# Patient Record
Sex: Male | Born: 1980 | Race: Black or African American | Hispanic: No | Marital: Married
Health system: Southern US, Community
[De-identification: ages and names within clinical notes are randomized; demographics above are authoritative.]

## PROBLEM LIST (undated history)

## (undated) ENCOUNTER — Emergency Department (HOSPITAL_COMMUNITY): Discharge status: Absent without leave | Payer: Self-pay

---

## 1998-10-16 ENCOUNTER — Emergency Department (HOSPITAL_COMMUNITY): Admission: EM | Admit: 1998-10-16 | Discharge: 1998-10-17 | Payer: Self-pay | Admitting: *Deleted

## 2002-05-09 ENCOUNTER — Encounter: Payer: Self-pay | Admitting: Emergency Medicine

## 2002-05-09 ENCOUNTER — Emergency Department (HOSPITAL_COMMUNITY): Admission: EM | Admit: 2002-05-09 | Discharge: 2002-05-09 | Payer: Self-pay | Admitting: Emergency Medicine

## 2002-06-29 ENCOUNTER — Emergency Department (HOSPITAL_COMMUNITY): Admission: EM | Admit: 2002-06-29 | Discharge: 2002-06-29 | Payer: Self-pay | Admitting: Emergency Medicine

## 2003-08-15 ENCOUNTER — Emergency Department (HOSPITAL_COMMUNITY): Admission: EM | Admit: 2003-08-15 | Discharge: 2003-08-15 | Payer: Self-pay | Admitting: *Deleted

## 2005-07-08 ENCOUNTER — Emergency Department (HOSPITAL_COMMUNITY): Admission: EM | Admit: 2005-07-08 | Discharge: 2005-07-08 | Payer: Self-pay | Admitting: Family Medicine

## 2005-07-28 ENCOUNTER — Emergency Department (HOSPITAL_COMMUNITY): Admission: EM | Admit: 2005-07-28 | Discharge: 2005-07-28 | Payer: Self-pay | Admitting: Family Medicine

## 2005-07-30 ENCOUNTER — Emergency Department (HOSPITAL_COMMUNITY): Admission: EM | Admit: 2005-07-30 | Discharge: 2005-07-30 | Payer: Self-pay | Admitting: Family Medicine

## 2007-04-23 ENCOUNTER — Emergency Department (HOSPITAL_COMMUNITY): Admission: EM | Admit: 2007-04-23 | Discharge: 2007-04-24 | Payer: Self-pay | Admitting: Emergency Medicine

## 2009-06-01 ENCOUNTER — Emergency Department (HOSPITAL_COMMUNITY): Admission: EM | Admit: 2009-06-01 | Discharge: 2009-06-01 | Payer: Self-pay | Admitting: Family Medicine

## 2012-01-16 ENCOUNTER — Emergency Department (HOSPITAL_COMMUNITY)
Admission: EM | Admit: 2012-01-16 | Discharge: 2012-01-16 | Disposition: A | Discharge status: Home / Self Care | Payer: Managed Care, Other (non HMO) | Attending: Emergency Medicine | Admitting: Emergency Medicine

## 2012-01-16 ENCOUNTER — Encounter (HOSPITAL_COMMUNITY): Payer: Self-pay | Admitting: Emergency Medicine

## 2012-01-16 DIAGNOSIS — F17200 Nicotine dependence, unspecified, uncomplicated: Secondary | ICD-10-CM | POA: Insufficient documentation

## 2012-01-16 DIAGNOSIS — J029 Acute pharyngitis, unspecified: Secondary | ICD-10-CM | POA: Insufficient documentation

## 2012-01-16 DIAGNOSIS — R509 Fever, unspecified: Secondary | ICD-10-CM | POA: Insufficient documentation

## 2012-01-16 MED ORDER — HYDROCODONE-ACETAMINOPHEN 7.5-325 MG/15ML PO SOLN: 15.0000 mL | Freq: Three times a day (TID) | ORAL | Status: DC | PRN

## 2012-01-16 MED ORDER — KETOROLAC TROMETHAMINE 60 MG/2ML IM SOLN
60.0000 mg | Freq: Once | INTRAMUSCULAR | Status: AC
  Administered 2012-01-16: 60 mg via INTRAMUSCULAR
  Filled 2012-01-16: qty 2

## 2012-01-16 NOTE — ED Provider Notes (Signed)
History     CSN: [id number]  Arrival date & time 01/16/12  5784   First MD Initiated Contact with Patient 01/16/12 [phone]      Chief Complaint  Patient presents with  . Sore Throat    (Consider location/radiation/quality/duration/timing/severity/associated sxs/prior treatment) HPI Comments: The patient complains of gradual onset of a sore throat which has been ongoing for 4 days, left side is worse than the right side, nothing seems to make it better, worse with swallowing. The pain does radiate to his left ear. He admits to a mild intermittent cough mild nasal congestion but no objective fevers. He does have subjective fevers. There is no rashes, swelling, vomiting. He has no known sick contacts. He took ibuprofen several hours ago with minimal improvement.  Patient is a 31 y.o. male presenting with pharyngitis. The history is provided by the patient.  Sore Throat Pertinent negatives include no abdominal pain.    History reviewed. No pertinent past medical history.  History reviewed. No pertinent past surgical history.  Family History  Problem Relation Age of Onset  . Diabetes Other   . CAD Other   . Thyroid disease Other   . Cancer Other     History  Substance Use Topics  . Smoking status: Current Some Day Smoker  . Smokeless tobacco: Not on file  . Alcohol Use: No      Review of Systems  Constitutional: Positive for fever (subjective).  HENT: Positive for sore throat.   Gastrointestinal: Negative for nausea, vomiting and abdominal pain.    Allergies  Review of patient's allergies indicates no known allergies.  Home Medications   Current Outpatient Rx  Name Route Sig Dispense Refill  . IBUPROFEN 200 MG PO TABS Oral Take 400 mg by mouth every 6 (six) hours as needed.    Marland Kitchen HYDROCODONE-ACETAMINOPHEN 7.5-325 MG/15ML PO SOLN Oral Take 15 mLs by mouth every 8 (eight) hours as needed for pain. 120 mL 0    BP 135/87  Pulse 89  Temp 98.8 F (37.1 C) (Oral)  Resp  20  SpO2 100%  Physical Exam  Nursing note and vitals reviewed. Constitutional: He appears well-developed and well-nourished. No distress.  HENT:  Head: Normocephalic and atraumatic.  Mouth/Throat: Oropharyngeal exudate (left-sided tonsillar exudate) present.       No asymmetry, hypertrophy of the posterior pharynx. Erythema present, left tonsillar exudate present.  Eyes: Conjunctivae normal are normal. No scleral icterus.  Neck: Normal range of motion. Neck supple. No thyromegaly present.  Cardiovascular: Normal rate and regular rhythm.   Pulmonary/Chest: Effort normal and breath sounds normal.  Lymphadenopathy:    He has no cervical adenopathy.  Neurological: He is alert.  Skin: Skin is warm and dry. No rash noted. He is not diaphoretic.    ED Course  Procedures (including critical care time)   Labs Reviewed  RAPID STREP SCREEN   No results found.   1. Pharyngitis       MDM  Overall the patient is well-appearing with normal vital signs, I have personally swabbed his throat for strep, rapid strep pending, Toradol given   Test negative, patient stable for discharge, hydrocodone suspension given for pain     Vida Roller, MD 01/16/12 [phone]

## 2012-01-16 NOTE — ED Notes (Signed)
Pt states his throat is sore and his left side of his throat is swollen  Pt states the pain radiates to his ear  Pt states his throat has a white coating on it

## 2012-03-23 ENCOUNTER — Emergency Department (HOSPITAL_COMMUNITY)
Admission: EM | Admit: 2012-03-23 | Discharge: 2012-03-24 | Disposition: A | Discharge status: Home / Self Care | Payer: Managed Care, Other (non HMO) | Attending: Emergency Medicine | Admitting: Emergency Medicine

## 2012-03-23 ENCOUNTER — Encounter (HOSPITAL_COMMUNITY): Payer: Self-pay | Admitting: *Deleted

## 2012-03-23 DIAGNOSIS — Z79899 Other long term (current) drug therapy: Secondary | ICD-10-CM | POA: Insufficient documentation

## 2012-03-23 DIAGNOSIS — R1012 Left upper quadrant pain: Secondary | ICD-10-CM | POA: Insufficient documentation

## 2012-03-23 DIAGNOSIS — K5289 Other specified noninfective gastroenteritis and colitis: Secondary | ICD-10-CM | POA: Insufficient documentation

## 2012-03-23 DIAGNOSIS — F17200 Nicotine dependence, unspecified, uncomplicated: Secondary | ICD-10-CM | POA: Insufficient documentation

## 2012-03-23 DIAGNOSIS — B9789 Other viral agents as the cause of diseases classified elsewhere: Secondary | ICD-10-CM | POA: Insufficient documentation

## 2012-03-23 DIAGNOSIS — B349 Viral infection, unspecified: Secondary | ICD-10-CM

## 2012-03-23 DIAGNOSIS — R112 Nausea with vomiting, unspecified: Secondary | ICD-10-CM | POA: Insufficient documentation

## 2012-03-23 DIAGNOSIS — K529 Noninfective gastroenteritis and colitis, unspecified: Secondary | ICD-10-CM

## 2012-03-23 MED ORDER — ACETAMINOPHEN-CODEINE #2 300-15 MG PO TABS: 1.0000 | ORAL_TABLET | ORAL | Status: AC | PRN

## 2012-03-23 MED ORDER — IBUPROFEN 400 MG PO TABS: 400.0000 mg | ORAL_TABLET | Freq: Four times a day (QID) | ORAL | Status: AC | PRN

## 2012-03-23 MED ORDER — ONDANSETRON 8 MG PO TBDP: 8.0000 mg | ORAL_TABLET | Freq: Three times a day (TID) | ORAL | Status: AC | PRN

## 2012-03-23 NOTE — ED Provider Notes (Signed)
History     CSN: [id number]  Arrival date & time 03/23/12  2223   First MD Initiated Contact with Patient 03/23/12 2300      Chief Complaint  Patient presents with  . Emesis    (Consider location/radiation/quality/duration/timing/severity/associated sxs/prior treatment) HPI Comments: Pt with no medical hx, surgical hx comes in with cc of abd pain. Pt's sx started last night, and between 9 pm last night and 4 pm tonight, he had 4 episodes of emesis and 3 episodes of BM - formed stools. He also has crampy abd pain. Reduced po intake. No fevers, chills, no weakness, sob, dizziness.    Patient is a 32 y.o. male presenting with vomiting. The history is provided by the patient.  Emesis  Associated symptoms include abdominal pain. Pertinent negatives include no cough.    History reviewed. No pertinent past medical history.  History reviewed. No pertinent past surgical history.  Family History  Problem Relation Age of Onset  . Diabetes Other   . CAD Other   . Thyroid disease Other   . Cancer Other     History  Substance Use Topics  . Smoking status: Current Some Day Smoker    Types: Cigarettes  . Smokeless tobacco: Not on file  . Alcohol Use: No      Review of Systems  Constitutional: Negative for activity change and appetite change.  Respiratory: Negative for cough and shortness of breath.   Cardiovascular: Negative for chest pain.  Gastrointestinal: Positive for nausea, vomiting and abdominal pain. Negative for blood in stool.  Genitourinary: Negative for dysuria.    Allergies  Review of patient's allergies indicates no known allergies.  Home Medications   Current Outpatient Rx  Name  Route  Sig  Dispense  Refill  . IBUPROFEN 200 MG PO TABS   Oral   Take 400 mg by mouth every 6 (six) hours as needed. pain         . ACETAMINOPHEN-CODEINE #2 300-15 MG PO TABS   Oral   Take 1 tablet by mouth every 4 (four) hours as needed for pain.   30 tablet   0   .  IBUPROFEN 400 MG PO TABS   Oral   Take 1 tablet (400 mg total) by mouth every 6 (six) hours as needed for pain.   30 tablet   0   . ONDANSETRON 8 MG PO TBDP   Oral   Take 1 tablet (8 mg total) by mouth every 8 (eight) hours as needed for nausea.   20 tablet   0     BP 124/75  Pulse 90  Temp 98.9 F (37.2 C)  Resp 20  SpO2 98%  Physical Exam  Constitutional: He is oriented to person, place, and time. He appears well-developed.  HENT:  Head: Normocephalic and atraumatic.  Eyes: Conjunctivae normal and EOM are normal. Pupils are equal, round, and reactive to light.  Neck: Normal range of motion. Neck supple.  Cardiovascular: Normal rate and regular rhythm.   Pulmonary/Chest: Effort normal and breath sounds normal.  Abdominal: Soft. Bowel sounds are normal. He exhibits no distension. There is tenderness. There is no rebound and no guarding.       Mild LUQ tenderness  Neurological: He is alert and oriented to person, place, and time.  Skin: Skin is warm.    ED Course  Procedures (including critical care time)  Labs Reviewed - No data to display No results found.   1. Viral syndrome  2. Gastroenteritis       MDM  Pt comes in with cc of abd pain, with some nausea and emesis. He has no abd surgery hx. No other concerning constitutionals. Suspect viral gastroenteritis. No objective evidence of dehydration. Will d/c.    Derwood Kaplan, MD 03/23/12 2356

## 2012-03-23 NOTE — ED Notes (Signed)
Pt c/o vomiting x 2 days; abd pain

## 2013-12-22 ENCOUNTER — Emergency Department (HOSPITAL_COMMUNITY): Payer: BC Managed Care – PPO

## 2013-12-22 ENCOUNTER — Inpatient Hospital Stay (HOSPITAL_COMMUNITY): Payer: BC Managed Care – PPO

## 2013-12-22 ENCOUNTER — Encounter (HOSPITAL_COMMUNITY): Payer: Self-pay | Admitting: Emergency Medicine

## 2013-12-22 ENCOUNTER — Encounter (HOSPITAL_COMMUNITY): Admission: EM | Disposition: E | Payer: Self-pay | Source: Home / Self Care

## 2013-12-22 ENCOUNTER — Encounter (HOSPITAL_COMMUNITY): Payer: BC Managed Care – PPO | Admitting: Anesthesiology

## 2013-12-22 ENCOUNTER — Emergency Department (HOSPITAL_COMMUNITY): Payer: BC Managed Care – PPO | Admitting: Anesthesiology

## 2013-12-22 ENCOUNTER — Inpatient Hospital Stay (HOSPITAL_COMMUNITY)
Admission: EM | Admit: 2013-12-22 | Discharge: 2014-01-15 | DRG: 907 | Disposition: E | Payer: BC Managed Care – PPO | Attending: General Surgery | Admitting: General Surgery

## 2013-12-22 DIAGNOSIS — Z9119 Patient's noncompliance with other medical treatment and regimen: Secondary | ICD-10-CM | POA: Diagnosis present

## 2013-12-22 DIAGNOSIS — W3400XA Accidental discharge from unspecified firearms or gun, initial encounter: Secondary | ICD-10-CM | POA: Diagnosis not present

## 2013-12-22 DIAGNOSIS — S31601A Unspecified open wound of abdominal wall, left upper quadrant with penetration into peritoneal cavity, initial encounter: Secondary | ICD-10-CM | POA: Diagnosis present

## 2013-12-22 DIAGNOSIS — R739 Hyperglycemia, unspecified: Secondary | ICD-10-CM | POA: Diagnosis present

## 2013-12-22 DIAGNOSIS — E870 Hyperosmolality and hypernatremia: Secondary | ICD-10-CM | POA: Diagnosis present

## 2013-12-22 DIAGNOSIS — N179 Acute kidney failure, unspecified: Secondary | ICD-10-CM | POA: Diagnosis present

## 2013-12-22 DIAGNOSIS — Z4659 Encounter for fitting and adjustment of other gastrointestinal appliance and device: Secondary | ICD-10-CM

## 2013-12-22 DIAGNOSIS — G9340 Encephalopathy, unspecified: Secondary | ICD-10-CM | POA: Diagnosis not present

## 2013-12-22 DIAGNOSIS — Z9889 Other specified postprocedural states: Secondary | ICD-10-CM

## 2013-12-22 DIAGNOSIS — J9811 Atelectasis: Secondary | ICD-10-CM | POA: Diagnosis not present

## 2013-12-22 DIAGNOSIS — T8119XA Other postprocedural shock, initial encounter: Secondary | ICD-10-CM | POA: Diagnosis not present

## 2013-12-22 DIAGNOSIS — K659 Peritonitis, unspecified: Secondary | ICD-10-CM | POA: Diagnosis not present

## 2013-12-22 DIAGNOSIS — D62 Acute posthemorrhagic anemia: Secondary | ICD-10-CM | POA: Diagnosis present

## 2013-12-22 DIAGNOSIS — K559 Vascular disorder of intestine, unspecified: Secondary | ICD-10-CM | POA: Diagnosis not present

## 2013-12-22 DIAGNOSIS — K7200 Acute and subacute hepatic failure without coma: Secondary | ICD-10-CM | POA: Diagnosis not present

## 2013-12-22 DIAGNOSIS — S35222A Major laceration of superior mesenteric artery, initial encounter: Secondary | ICD-10-CM | POA: Diagnosis present

## 2013-12-22 DIAGNOSIS — R6520 Severe sepsis without septic shock: Secondary | ICD-10-CM

## 2013-12-22 DIAGNOSIS — A419 Sepsis, unspecified organism: Secondary | ICD-10-CM

## 2013-12-22 DIAGNOSIS — Z978 Presence of other specified devices: Secondary | ICD-10-CM

## 2013-12-22 DIAGNOSIS — E872 Acidosis: Secondary | ICD-10-CM | POA: Diagnosis not present

## 2013-12-22 DIAGNOSIS — S31109A Unspecified open wound of abdominal wall, unspecified quadrant without penetration into peritoneal cavity, initial encounter: Secondary | ICD-10-CM | POA: Diagnosis present

## 2013-12-22 DIAGNOSIS — S36499A Other injury of unspecified part of small intestine, initial encounter: Secondary | ICD-10-CM | POA: Diagnosis present

## 2013-12-22 DIAGNOSIS — R6521 Severe sepsis with septic shock: Secondary | ICD-10-CM | POA: Diagnosis not present

## 2013-12-22 DIAGNOSIS — I472 Ventricular tachycardia: Secondary | ICD-10-CM | POA: Diagnosis present

## 2013-12-22 DIAGNOSIS — S36290A Other injury of head of pancreas, initial encounter: Secondary | ICD-10-CM | POA: Diagnosis present

## 2013-12-22 DIAGNOSIS — J9690 Respiratory failure, unspecified, unspecified whether with hypoxia or hypercapnia: Secondary | ICD-10-CM | POA: Diagnosis not present

## 2013-12-22 DIAGNOSIS — I959 Hypotension, unspecified: Secondary | ICD-10-CM | POA: Diagnosis present

## 2013-12-22 DIAGNOSIS — J9601 Acute respiratory failure with hypoxia: Secondary | ICD-10-CM

## 2013-12-22 DIAGNOSIS — R571 Hypovolemic shock: Secondary | ICD-10-CM

## 2013-12-22 DIAGNOSIS — E162 Hypoglycemia, unspecified: Secondary | ICD-10-CM | POA: Diagnosis not present

## 2013-12-22 DIAGNOSIS — J939 Pneumothorax, unspecified: Secondary | ICD-10-CM

## 2013-12-22 DIAGNOSIS — D65 Disseminated intravascular coagulation [defibrination syndrome]: Secondary | ICD-10-CM | POA: Diagnosis present

## 2013-12-22 DIAGNOSIS — S31139A Puncture wound of abdominal wall without foreign body, unspecified quadrant without penetration into peritoneal cavity, initial encounter: Secondary | ICD-10-CM

## 2013-12-22 DIAGNOSIS — Z452 Encounter for adjustment and management of vascular access device: Secondary | ICD-10-CM

## 2013-12-22 HISTORY — PX: APPLICATION OF WOUND VAC: SHX5189

## 2013-12-22 HISTORY — PX: BOWEL RESECTION: SHX1257

## 2013-12-22 HISTORY — PX: LAPAROTOMY: SHX154

## 2013-12-22 HISTORY — PX: MESENTERIC ARTERY BYPASS: SHX5968

## 2013-12-22 HISTORY — PX: CHEST TUBE INSERTION: SHX231

## 2013-12-22 HISTORY — PX: THORACIC AORTIC ANEURYSM REPAIR: SHX799

## 2013-12-22 LAB — POCT I-STAT 7, (LYTES, BLD GAS, ICA,H+H)
ACID-BASE DEFICIT: 3 mmol/L — AB (ref 0.0–2.0)
Acid-Base Excess: 1 mmol/L (ref 0.0–2.0)
Acid-base deficit: 17 mmol/L — ABNORMAL HIGH (ref 0.0–2.0)
Acid-base deficit: 26 mmol/L — ABNORMAL HIGH (ref 0.0–2.0)
Acid-base deficit: 11 mmol/L — ABNORMAL HIGH (ref 0.0–2.0)
Acid-base deficit: 7 mmol/L — ABNORMAL HIGH (ref 0.0–2.0)
Acid-base deficit: 4 mmol/L — ABNORMAL HIGH (ref 0.0–2.0)
Acid-base deficit: 1 mmol/L (ref 0.0–2.0)
BICARBONATE: 7.9 meq/L — AB (ref 20.0–24.0)
Bicarbonate: 13.7 meq/L — ABNORMAL LOW (ref 20.0–24.0)
Bicarbonate: 16.7 meq/L — ABNORMAL LOW (ref 20.0–24.0)
Bicarbonate: 19.7 meq/L — ABNORMAL LOW (ref 20.0–24.0)
Bicarbonate: 21.9 meq/L (ref 20.0–24.0)
Bicarbonate: 23.1 mEq/L (ref 20.0–24.0)
Bicarbonate: 25.2 mEq/L — ABNORMAL HIGH (ref 20.0–24.0)
Bicarbonate: 21 mEq/L (ref 20.0–24.0)
CALCIUM ION: 0.79 mmol/L — AB (ref 1.12–1.23)
CALCIUM ION: 1.05 mmol/L — AB (ref 1.12–1.23)
Calcium, Ion: 0.6 mmol/L — CL (ref 1.12–1.23)
Calcium, Ion: 0.89 mmol/L — ABNORMAL LOW (ref 1.12–1.23)
Calcium, Ion: 0.65 mmol/L — ABNORMAL LOW (ref 1.12–1.23)
Calcium, Ion: 0.68 mmol/L — ABNORMAL LOW (ref 1.12–1.23)
Calcium, Ion: 0.63 mmol/L — CL (ref 1.12–1.23)
Calcium, Ion: 0.93 mmol/L — ABNORMAL LOW (ref 1.12–1.23)
HCT: 20 % — ABNORMAL LOW (ref 39.0–52.0)
HCT: 12 % — ABNORMAL LOW (ref 39.0–52.0)
HCT: 26 % — ABNORMAL LOW (ref 39.0–52.0)
HCT: 25 % — ABNORMAL LOW (ref 39.0–52.0)
HCT: 17 % — ABNORMAL LOW (ref 39.0–52.0)
HCT: 26 % — ABNORMAL LOW (ref 39.0–52.0)
HEMATOCRIT: 19 % — AB (ref 39.0–52.0)
HEMATOCRIT: 32 % — AB (ref 39.0–52.0)
HEMOGLOBIN: 6.5 g/dL — AB (ref 13.0–17.0)
Hemoglobin: 6.8 g/dL — CL (ref 13.0–17.0)
Hemoglobin: 4.1 g/dL — CL (ref 13.0–17.0)
Hemoglobin: 8.8 g/dL — ABNORMAL LOW (ref 13.0–17.0)
Hemoglobin: 8.5 g/dL — ABNORMAL LOW (ref 13.0–17.0)
Hemoglobin: 5.8 g/dL — CL (ref 13.0–17.0)
Hemoglobin: 8.8 g/dL — ABNORMAL LOW (ref 13.0–17.0)
Hemoglobin: 10.9 g/dL — ABNORMAL LOW (ref 13.0–17.0)
O2 SAT: 100 %
O2 SAT: 66 %
O2 Saturation: 100 %
O2 Saturation: 100 %
O2 Saturation: 100 %
O2 Saturation: 100 %
O2 Saturation: 100 %
O2 Saturation: 99 %
PCO2 ART: 55.2 mmHg — AB (ref 35.0–45.0)
PCO2 ART: 34.2 mmHg — AB (ref 35.0–45.0)
PCO2 ART: 36 mmHg (ref 35.0–45.0)
PCO2 ART: 33.8 mmHg — AB (ref 35.0–45.0)
PH ART: 6.739 — AB (ref 7.350–7.450)
PO2 ART: 128 mmHg — AB (ref 80.0–100.0)
POTASSIUM: 3.8 meq/L (ref 3.7–5.3)
POTASSIUM: 3.1 meq/L — AB (ref 3.7–5.3)
Patient temperature: 34.7
Patient temperature: 34.8
Patient temperature: 34.9
Patient temperature: 35.2
Potassium: 5.4 mEq/L — ABNORMAL HIGH (ref 3.7–5.3)
Potassium: 6.4 meq/L — ABNORMAL HIGH (ref 3.7–5.3)
Potassium: 5.7 meq/L — ABNORMAL HIGH (ref 3.7–5.3)
Potassium: 5.7 meq/L — ABNORMAL HIGH (ref 3.7–5.3)
Potassium: 5.5 meq/L — ABNORMAL HIGH (ref 3.7–5.3)
Potassium: 3.5 mEq/L — ABNORMAL LOW (ref 3.7–5.3)
SODIUM: 149 meq/L — AB (ref 137–147)
Sodium: 140 mEq/L (ref 137–147)
Sodium: 140 meq/L (ref 137–147)
Sodium: 143 meq/L (ref 137–147)
Sodium: 143 meq/L (ref 137–147)
Sodium: 144 meq/L (ref 137–147)
Sodium: 149 mEq/L — ABNORMAL HIGH (ref 137–147)
Sodium: 149 mEq/L — ABNORMAL HIGH (ref 137–147)
TCO2: 10 mmol/L (ref 0–100)
TCO2: 16 mmol/L (ref 0–100)
TCO2: 18 mmol/L (ref 0–100)
TCO2: 21 mmol/L (ref 0–100)
TCO2: 23 mmol/L (ref 0–100)
TCO2: 24 mmol/L (ref 0–100)
TCO2: 26 mmol/L (ref 0–100)
TCO2: 22 mmol/L (ref 0–100)
pCO2 arterial: 55.5 mmHg — ABNORMAL HIGH (ref 35.0–45.0)
pCO2 arterial: 42.5 mmHg (ref 35.0–45.0)
pCO2 arterial: 42.9 mmHg (ref 35.0–45.0)
pCO2 arterial: 38.6 mmHg (ref 35.0–45.0)
pH, Arterial: 6.984 — CL (ref 7.350–7.450)
pH, Arterial: 7.185 — CL (ref 7.350–7.450)
pH, Arterial: 7.263 — ABNORMAL LOW (ref 7.350–7.450)
pH, Arterial: 7.354 (ref 7.350–7.450)
pH, Arterial: 7.433 (ref 7.350–7.450)
pH, Arterial: 7.453 — ABNORMAL HIGH (ref 7.350–7.450)
pH, Arterial: 7.403 (ref 7.350–7.450)
pO2, Arterial: 348 mmHg — ABNORMAL HIGH (ref 80.0–100.0)
pO2, Arterial: 366 mmHg — ABNORMAL HIGH (ref 80.0–100.0)
pO2, Arterial: 363 mmHg — ABNORMAL HIGH (ref 80.0–100.0)
pO2, Arterial: 454 mmHg — ABNORMAL HIGH (ref 80.0–100.0)
pO2, Arterial: 398 mmHg — ABNORMAL HIGH (ref 80.0–100.0)
pO2, Arterial: 237 mmHg — ABNORMAL HIGH (ref 80.0–100.0)
pO2, Arterial: 34 mmHg — CL (ref 80.0–100.0)

## 2013-12-22 LAB — POCT I-STAT 3, ART BLOOD GAS (G3+)
ACID-BASE DEFICIT: 3 mmol/L — AB (ref 0.0–2.0)
ACID-BASE DEFICIT: 6 mmol/L — AB (ref 0.0–2.0)
ACID-BASE DEFICIT: 6 mmol/L — AB (ref 0.0–2.0)
ACID-BASE DEFICIT: 4 mmol/L — AB (ref 0.0–2.0)
BICARBONATE: 23.1 meq/L (ref 20.0–24.0)
BICARBONATE: 19.5 meq/L — AB (ref 20.0–24.0)
Bicarbonate: 20.7 mEq/L (ref 20.0–24.0)
Bicarbonate: 20 mEq/L (ref 20.0–24.0)
O2 SAT: 99 %
O2 Saturation: 83 %
O2 Saturation: 100 %
O2 Saturation: 100 %
PCO2 ART: 46.7 mmHg — AB (ref 35.0–45.0)
PCO2 ART: 32.5 mmHg — AB (ref 35.0–45.0)
PH ART: 7.307 — AB (ref 7.350–7.450)
PH ART: 7.325 — AB (ref 7.350–7.450)
PO2 ART: 52 mmHg — AB (ref 80.0–100.0)
TCO2: 24 mmol/L (ref 0–100)
TCO2: 22 mmol/L (ref 0–100)
TCO2: 21 mmol/L (ref 0–100)
TCO2: 21 mmol/L (ref 0–100)
pCO2 arterial: 46.1 mmHg — ABNORMAL HIGH (ref 35.0–45.0)
pCO2 arterial: 37.4 mmHg (ref 35.0–45.0)
pH, Arterial: 7.255 — ABNORMAL LOW (ref 7.350–7.450)
pH, Arterial: 7.396 (ref 7.350–7.450)
pO2, Arterial: 357 mmHg — ABNORMAL HIGH (ref 80.0–100.0)
pO2, Arterial: 189 mmHg — ABNORMAL HIGH (ref 80.0–100.0)
pO2, Arterial: 139 mmHg — ABNORMAL HIGH (ref 80.0–100.0)

## 2013-12-22 LAB — BASIC METABOLIC PANEL
Anion gap: 18 — ABNORMAL HIGH (ref 5–15)
Anion gap: 20 — ABNORMAL HIGH (ref 5–15)
BUN: 16 mg/dL (ref 6–23)
BUN: 20 mg/dL (ref 6–23)
CHLORIDE: 109 meq/L (ref 96–112)
CO2: 23 mEq/L (ref 19–32)
CO2: 26 meq/L (ref 19–32)
Calcium: 8.5 mg/dL (ref 8.4–10.5)
Calcium: 7.1 mg/dL — ABNORMAL LOW (ref 8.4–10.5)
Chloride: 108 mEq/L (ref 96–112)
Creatinine, Ser: 1.67 mg/dL — ABNORMAL HIGH (ref 0.50–1.35)
Creatinine, Ser: 2.71 mg/dL — ABNORMAL HIGH (ref 0.50–1.35)
GFR calc Af Amer: 60 mL/min — ABNORMAL LOW (ref >90)
GFR calc Af Amer: 33 mL/min — ABNORMAL LOW (ref >90)
GFR calc non Af Amer: 52 mL/min — ABNORMAL LOW (ref >90)
GFR calc non Af Amer: 29 mL/min — ABNORMAL LOW (ref >90)
GLUCOSE: 100 mg/dL — AB (ref 70–99)
GLUCOSE: 213 mg/dL — AB (ref 70–99)
POTASSIUM: 2.9 meq/L — AB (ref 3.7–5.3)
POTASSIUM: 3.3 meq/L — AB (ref 3.7–5.3)
SODIUM: 150 meq/L — AB (ref 137–147)
SODIUM: 154 meq/L — AB (ref 137–147)

## 2013-12-22 LAB — CBC
HCT: 31.4 % — ABNORMAL LOW (ref 39.0–52.0)
HCT: 35 % — ABNORMAL LOW (ref 39.0–52.0)
HEMATOCRIT: 34.3 % — AB (ref 39.0–52.0)
HEMATOCRIT: 36.1 % — AB (ref 39.0–52.0)
HEMATOCRIT: 33.9 % — AB (ref 39.0–52.0)
HEMOGLOBIN: 10.4 g/dL — AB (ref 13.0–17.0)
HEMOGLOBIN: 12.4 g/dL — AB (ref 13.0–17.0)
Hemoglobin: 10.6 g/dL — ABNORMAL LOW (ref 13.0–17.0)
Hemoglobin: 11.7 g/dL — ABNORMAL LOW (ref 13.0–17.0)
Hemoglobin: 12.4 g/dL — ABNORMAL LOW (ref 13.0–17.0)
MCH: 28 pg (ref 26.0–34.0)
MCH: 29.8 pg (ref 26.0–34.0)
MCH: 29 pg (ref 26.0–34.0)
MCH: 29 pg (ref 26.0–34.0)
MCH: 29.1 pg (ref 26.0–34.0)
MCHC: 30.9 g/dL (ref 30.0–36.0)
MCHC: 33.1 g/dL (ref 30.0–36.0)
MCHC: 34.3 g/dL (ref 30.0–36.0)
MCHC: 34.5 g/dL (ref 30.0–36.0)
MCHC: 35.4 g/dL (ref 30.0–36.0)
MCV: 90.5 fL (ref 78.0–100.0)
MCV: 90 fL (ref 78.0–100.0)
MCV: 84.3 fL (ref 78.0–100.0)
MCV: 84.1 fL (ref 78.0–100.0)
MCV: 82.2 fL (ref 78.0–100.0)
PLATELETS: 61 10*3/uL — AB (ref 150–400)
Platelets: 184 10*3/uL (ref 150–400)
Platelets: 53 10*3/uL — ABNORMAL LOW (ref 150–400)
Platelets: 58 10*3/uL — ABNORMAL LOW (ref 150–400)
Platelets: 51 10*3/uL — ABNORMAL LOW (ref 150–400)
RBC: 3.79 MIL/uL — AB (ref 4.22–5.81)
RBC: 3.49 MIL/uL — ABNORMAL LOW (ref 4.22–5.81)
RBC: 4.28 MIL/uL (ref 4.22–5.81)
RBC: 4.03 MIL/uL — AB (ref 4.22–5.81)
RBC: 4.26 MIL/uL (ref 4.22–5.81)
RDW: 14.4 % (ref 11.5–15.5)
RDW: 15.7 % — AB (ref 11.5–15.5)
RDW: 14.8 % (ref 11.5–15.5)
RDW: 15 % (ref 11.5–15.5)
RDW: 15.8 % — ABNORMAL HIGH (ref 11.5–15.5)
WBC: 3.5 10*3/uL — AB (ref 4.0–10.5)
WBC: 8.5 10*3/uL (ref 4.0–10.5)
WBC: 3.9 10*3/uL — AB (ref 4.0–10.5)
WBC: 4.5 10*3/uL (ref 4.0–10.5)
WBC: 8.4 10*3/uL (ref 4.0–10.5)

## 2013-12-22 LAB — I-STAT CHEM 8, ED
BUN: 16 mg/dL (ref 6–23)
CHLORIDE: 109 meq/L (ref 96–112)
Calcium, Ion: 1.16 mmol/L (ref 1.12–1.23)
Creatinine, Ser: 2.2 mg/dL — ABNORMAL HIGH (ref 0.50–1.35)
GLUCOSE: 160 mg/dL — AB (ref 70–99)
HEMATOCRIT: 33 % — AB (ref 39.0–52.0)
HEMOGLOBIN: 11.2 g/dL — AB (ref 13.0–17.0)
Potassium: 4.1 mEq/L (ref 3.7–5.3)
Sodium: 145 mEq/L (ref 137–147)
TCO2: 14 mmol/L (ref 0–100)

## 2013-12-22 LAB — COMPREHENSIVE METABOLIC PANEL WITH GFR
ALT: 19 U/L (ref 0–53)
ALT: 1028 U/L — ABNORMAL HIGH (ref 0–53)
AST: 19 U/L (ref 0–37)
AST: 646 U/L — ABNORMAL HIGH (ref 0–37)
Albumin: 2.8 g/dL — ABNORMAL LOW (ref 3.5–5.2)
Albumin: 1.3 g/dL — ABNORMAL LOW (ref 3.5–5.2)
Alkaline Phosphatase: 74 U/L (ref 39–117)
Alkaline Phosphatase: 36 U/L — ABNORMAL LOW (ref 39–117)
Anion gap: 32 — ABNORMAL HIGH (ref 5–15)
Anion gap: 28 — ABNORMAL HIGH (ref 5–15)
BUN: 17 mg/dL (ref 6–23)
BUN: 15 mg/dL (ref 6–23)
CO2: 12 meq/L — ABNORMAL LOW (ref 19–32)
CO2: 16 meq/L — ABNORMAL LOW (ref 19–32)
Calcium: 9.5 mg/dL (ref 8.4–10.5)
Calcium: 8.5 mg/dL (ref 8.4–10.5)
Chloride: 106 meq/L (ref 96–112)
Chloride: 104 meq/L (ref 96–112)
Creatinine, Ser: 2.22 mg/dL — ABNORMAL HIGH (ref 0.50–1.35)
Creatinine, Ser: 1.47 mg/dL — ABNORMAL HIGH (ref 0.50–1.35)
GFR calc Af Amer: 42 mL/min — ABNORMAL LOW
GFR calc Af Amer: 70 mL/min — ABNORMAL LOW
GFR calc non Af Amer: 37 mL/min — ABNORMAL LOW
GFR calc non Af Amer: 60 mL/min — ABNORMAL LOW
Glucose, Bld: 174 mg/dL — ABNORMAL HIGH (ref 70–99)
Glucose, Bld: 261 mg/dL — ABNORMAL HIGH (ref 70–99)
Potassium: 4.4 meq/L (ref 3.7–5.3)
Potassium: 5.8 meq/L — ABNORMAL HIGH (ref 3.7–5.3)
Sodium: 150 meq/L — ABNORMAL HIGH (ref 137–147)
Sodium: 148 meq/L — ABNORMAL HIGH (ref 137–147)
Total Bilirubin: 0.2 mg/dL — ABNORMAL LOW (ref 0.3–1.2)
Total Bilirubin: <0.2 mg/dL — ABNORMAL LOW (ref 0.3–1.2)
Total Protein: 5.4 g/dL — ABNORMAL LOW (ref 6.0–8.3)
Total Protein: 2.5 g/dL — ABNORMAL LOW (ref 6.0–8.3)

## 2013-12-22 LAB — PROTIME-INR
INR: 1.38 (ref 0.00–1.49)
INR: 2.1 — ABNORMAL HIGH (ref 0.00–1.49)
INR: 1.48 (ref 0.00–1.49)
INR: 1.31 (ref 0.00–1.49)
PROTHROMBIN TIME: 17 s — AB (ref 11.6–15.2)
Prothrombin Time: 23.6 s — ABNORMAL HIGH (ref 11.6–15.2)
Prothrombin Time: 17.9 seconds — ABNORMAL HIGH (ref 11.6–15.2)
Prothrombin Time: 16.3 seconds — ABNORMAL HIGH (ref 11.6–15.2)

## 2013-12-22 LAB — GLUCOSE, CAPILLARY
GLUCOSE-CAPILLARY: 73 mg/dL (ref 70–99)
GLUCOSE-CAPILLARY: 192 mg/dL — AB (ref 70–99)
GLUCOSE-CAPILLARY: 165 mg/dL — AB (ref 70–99)
Glucose-Capillary: 130 mg/dL — ABNORMAL HIGH (ref 70–99)
Glucose-Capillary: 96 mg/dL (ref 70–99)

## 2013-12-22 LAB — PREPARE RBC (CROSSMATCH)

## 2013-12-22 LAB — RAPID URINE DRUG SCREEN, HOSP PERFORMED
Amphetamines: NOT DETECTED
BENZODIAZEPINES: POSITIVE — AB
Barbiturates: NOT DETECTED
Cocaine: NOT DETECTED
Opiates: NOT DETECTED
Tetrahydrocannabinol: NOT DETECTED

## 2013-12-22 LAB — ETHANOL: Alcohol, Ethyl (B): 78 mg/dL — ABNORMAL HIGH (ref 0–11)

## 2013-12-22 LAB — POCT I-STAT 4, (NA,K, GLUC, HGB,HCT)
GLUCOSE: 253 mg/dL — AB (ref 70–99)
Glucose, Bld: 241 mg/dL — ABNORMAL HIGH (ref 70–99)
HCT: 19 % — ABNORMAL LOW (ref 39.0–52.0)
HCT: 26 % — ABNORMAL LOW (ref 39.0–52.0)
Hemoglobin: 6.5 g/dL — CL (ref 13.0–17.0)
Hemoglobin: 8.8 g/dL — ABNORMAL LOW (ref 13.0–17.0)
POTASSIUM: 6.3 meq/L — AB (ref 3.7–5.3)
Potassium: 5.5 mEq/L — ABNORMAL HIGH (ref 3.7–5.3)
Sodium: 140 mEq/L (ref 137–147)
Sodium: 145 mEq/L (ref 137–147)

## 2013-12-22 LAB — MRSA PCR SCREENING: MRSA BY PCR: NEGATIVE

## 2013-12-22 LAB — ABO/RH: ABO/RH(D): O POS

## 2013-12-22 LAB — DIC (DISSEMINATED INTRAVASCULAR COAGULATION)PANEL
D-Dimer, Quant: <0.27 ug{FEU}/mL (ref 0.00–0.48)
Fibrinogen: 98 mg/dL — CL (ref 204–475)
INR: 2.1 — ABNORMAL HIGH (ref 0.00–1.49)
Platelets: 53 K/uL — ABNORMAL LOW (ref 150–400)
Prothrombin Time: 23.6 s — ABNORMAL HIGH (ref 11.6–15.2)
Smear Review: NONE SEEN
aPTT: 71 s — ABNORMAL HIGH (ref 24–37)

## 2013-12-22 LAB — MASSIVE TRANSFUSION PROTOCOL ORDER (BLOOD BANK NOTIFICATION)

## 2013-12-22 LAB — I-STAT TROPONIN, ED: Troponin i, poc: 0.04 ng/mL (ref 0.00–0.08)

## 2013-12-22 LAB — I-STAT CG4 LACTIC ACID, ED: Lactic Acid, Venous: 15.88 mmol/L — ABNORMAL HIGH (ref 0.5–2.2)

## 2013-12-22 LAB — APTT: aPTT: 71 seconds — ABNORMAL HIGH (ref 24–37)

## 2013-12-22 LAB — TRIGLYCERIDES: Triglycerides: 85 mg/dL (ref <150)

## 2013-12-22 LAB — AMYLASE: AMYLASE: 231 U/L — AB (ref 0–105)

## 2013-12-22 LAB — CDS SEROLOGY

## 2013-12-22 SURGERY — APPLICATION, WOUND VAC
Anesthesia: General | Site: Chest

## 2013-12-22 MED ORDER — CEFAZOLIN SODIUM-DEXTROSE 2-3 GM-% IV SOLR
INTRAVENOUS | Status: AC
  Filled 2013-12-22: qty 50

## 2013-12-22 MED ORDER — PROPOFOL 10 MG/ML IV BOLUS
INTRAVENOUS | Status: AC
  Filled 2013-12-22: qty 20

## 2013-12-22 MED ORDER — VECURONIUM BROMIDE 10 MG IV SOLR
INTRAVENOUS | Status: AC
  Filled 2013-12-22: qty 10

## 2013-12-22 MED ORDER — TRANEXAMIC ACID 100 MG/ML IV SOLN
1000.0000 mg | Freq: Once | INTRAVENOUS | Status: AC
  Administered 2013-12-22: 1 mg via INTRAVENOUS
  Administered 2013-12-22: 1000 mg via INTRAVENOUS
  Filled 2013-12-22: qty 10

## 2013-12-22 MED ORDER — ALBUMIN HUMAN 5 % IV SOLN
INTRAVENOUS | Status: DC | PRN
  Administered 2013-12-22 (×2): via INTRAVENOUS

## 2013-12-22 MED ORDER — LACTATED RINGERS IV SOLN
INTRAVENOUS | Status: DC | PRN
  Administered 2013-12-22 (×3): via INTRAVENOUS

## 2013-12-22 MED ORDER — CALCIUM CHLORIDE 10 % IV SOLN
INTRAVENOUS | Status: DC | PRN
  Administered 2013-12-22 (×5): 1 g via INTRAVENOUS

## 2013-12-22 MED ORDER — PANTOPRAZOLE SODIUM 40 MG PO TBEC
40.0000 mg | DELAYED_RELEASE_TABLET | Freq: Every day | ORAL | Status: DC
  Filled 2013-12-22: qty 1

## 2013-12-22 MED ORDER — POTASSIUM CHLORIDE 10 MEQ/50ML IV SOLN
10.0000 meq | INTRAVENOUS | Status: AC
  Administered 2013-12-22 (×4): 10 meq via INTRAVENOUS
  Filled 2013-12-22 (×4): qty 50

## 2013-12-22 MED ORDER — FENTANYL CITRATE 0.05 MG/ML IJ SOLN
INTRAMUSCULAR | Status: AC
  Filled 2013-12-22: qty 5

## 2013-12-22 MED ORDER — INSULIN ASPART 100 UNIT/ML ~~LOC~~ SOLN
SUBCUTANEOUS | Status: DC | PRN
  Administered 2013-12-22 (×2): 10 [IU] via SUBCUTANEOUS

## 2013-12-22 MED ORDER — DEXTROSE 50 % IV SOLN
INTRAVENOUS | Status: DC | PRN
  Administered 2013-12-22 (×2): 12.5 g via INTRAVENOUS

## 2013-12-22 MED ORDER — 0.9 % SODIUM CHLORIDE (POUR BTL) OPTIME
TOPICAL | Status: DC | PRN
  Administered 2013-12-22: 2000 mL

## 2013-12-22 MED ORDER — SODIUM BICARBONATE 8.4 % IV SOLN
INTRAVENOUS | Status: AC
  Filled 2013-12-22: qty 50

## 2013-12-22 MED ORDER — DEXTROSE-NACL 5-0.45 % IV SOLN
INTRAVENOUS | Status: DC
  Administered 2013-12-22 – 2013-12-23 (×3): via INTRAVENOUS
  Filled 2013-12-22 (×5): qty 1000

## 2013-12-22 MED ORDER — CALCIUM CHLORIDE 10 % IV SOLN
INTRAVENOUS | Status: AC
  Filled 2013-12-22: qty 20

## 2013-12-22 MED ORDER — VECURONIUM BROMIDE 10 MG IV SOLR
INTRAVENOUS | Status: DC | PRN
  Administered 2013-12-22 (×3): 10 mg via INTRAVENOUS

## 2013-12-22 MED ORDER — ONDANSETRON HCL 4 MG PO TABS
4.0000 mg | ORAL_TABLET | Freq: Four times a day (QID) | ORAL | Status: DC | PRN
  Filled 2013-12-22: qty 1

## 2013-12-22 MED ORDER — MIDAZOLAM HCL 2 MG/2ML IJ SOLN
INTRAMUSCULAR | Status: AC
  Filled 2013-12-22: qty 2

## 2013-12-22 MED ORDER — ATROPINE SULFATE 0.1 MG/ML IJ SOLN: 1.0000 mg | Freq: Once | INTRAMUSCULAR | Status: DC

## 2013-12-22 MED ORDER — CETYLPYRIDINIUM CHLORIDE 0.05 % MT LIQD
7.0000 mL | Freq: Four times a day (QID) | OROMUCOSAL | Status: DC
  Administered 2013-12-22 – 2013-12-24 (×7): 7 mL via OROMUCOSAL

## 2013-12-22 MED ORDER — FENTANYL CITRATE 0.05 MG/ML IJ SOLN
INTRAMUSCULAR | Status: DC | PRN
  Administered 2013-12-22: 50 ug via INTRAVENOUS
  Administered 2013-12-22 (×2): 100 ug via INTRAVENOUS
  Administered 2013-12-22: 150 ug via INTRAVENOUS

## 2013-12-22 MED ORDER — SODIUM CHLORIDE 0.9 % IV BOLUS (SEPSIS)
1000.0000 mL | Freq: Once | INTRAVENOUS | Status: AC
  Administered 2013-12-22: 1000 mL via INTRAVENOUS

## 2013-12-22 MED ORDER — ARTIFICIAL TEARS OP OINT
TOPICAL_OINTMENT | OPHTHALMIC | Status: AC
  Filled 2013-12-22: qty 3.5

## 2013-12-22 MED ORDER — SODIUM BICARBONATE 8.4 % IV SOLN
INTRAVENOUS | Status: DC | PRN
  Administered 2013-12-22 (×13): 50 meq via INTRAVENOUS

## 2013-12-22 MED ORDER — ARTIFICIAL TEARS OP OINT
TOPICAL_OINTMENT | OPHTHALMIC | Status: DC | PRN
  Administered 2013-12-22 – 2013-12-23 (×5): 1 via OPHTHALMIC
  Filled 2013-12-22: qty 3.5

## 2013-12-22 MED ORDER — PROPOFOL 10 MG/ML IV EMUL
0.0000 ug/kg/min | INTRAVENOUS | Status: DC
  Administered 2013-12-22: 20 ug/kg/min via INTRAVENOUS
  Administered 2013-12-22: 13.123 ug/kg/min via INTRAVENOUS
  Administered 2013-12-22: 32.808 ug/kg/min via INTRAVENOUS
  Administered 2013-12-23: 50 ug/kg/min via INTRAVENOUS
  Administered 2013-12-23 (×2): 20 ug/kg/min via INTRAVENOUS
  Filled 2013-12-22 (×6): qty 100

## 2013-12-22 MED ORDER — SODIUM CHLORIDE 0.9 % IV SOLN
0.0000 ug/h | INTRAVENOUS | Status: DC
  Administered 2013-12-22: 40 ug/h via INTRAVENOUS
  Administered 2013-12-23: 150 ug/h via INTRAVENOUS
  Filled 2013-12-22 (×3): qty 50

## 2013-12-22 MED ORDER — MIDAZOLAM HCL 2 MG/2ML IJ SOLN
INTRAMUSCULAR | Status: AC
  Filled 2013-12-22: qty 6

## 2013-12-22 MED ORDER — PROPOFOL 10 MG/ML IV EMUL
INTRAVENOUS | Status: AC
  Filled 2013-12-22: qty 100

## 2013-12-22 MED ORDER — HYDROCORTISONE NA SUCCINATE PF 1000 MG IJ SOLR
INTRAMUSCULAR | Status: DC | PRN
  Administered 2013-12-22: 100 mg via INTRAVENOUS

## 2013-12-22 MED ORDER — FENTANYL CITRATE 0.05 MG/ML IJ SOLN
100.0000 ug | INTRAMUSCULAR | Status: DC | PRN
  Administered 2013-12-22 (×2): 100 ug via INTRAVENOUS
  Filled 2013-12-22 (×2): qty 2

## 2013-12-22 MED ORDER — PHENYLEPHRINE HCL 10 MG/ML IJ SOLN
0.0000 ug/min | INTRAVENOUS | Status: DC
  Administered 2013-12-22: 200 ug/min via INTRAVENOUS
  Administered 2013-12-22: 20 ug/min via INTRAVENOUS
  Filled 2013-12-22 (×2): qty 1

## 2013-12-22 MED ORDER — STERILE WATER FOR INJECTION IJ SOLN
INTRAMUSCULAR | Status: AC
  Filled 2013-12-22: qty 10

## 2013-12-22 MED ORDER — ONDANSETRON HCL 4 MG/2ML IJ SOLN
4.0000 mg | Freq: Four times a day (QID) | INTRAMUSCULAR | Status: DC | PRN
Start: 2013-12-22 — End: 2013-12-24
  Filled 2013-12-22: qty 2

## 2013-12-22 MED ORDER — MIDAZOLAM HCL 5 MG/5ML IJ SOLN
INTRAMUSCULAR | Status: AC | PRN
  Administered 2013-12-22: 2 mg via INTRAVENOUS

## 2013-12-22 MED ORDER — SODIUM CHLORIDE 0.9 % IV SOLN
Freq: Once | INTRAVENOUS | Status: AC
  Administered 2013-12-23: 16:00:00 via INTRAVENOUS

## 2013-12-22 MED ORDER — PANTOPRAZOLE SODIUM 40 MG IV SOLR
40.0000 mg | Freq: Every day | INTRAVENOUS | Status: DC
  Administered 2013-12-22 – 2013-12-23 (×2): 40 mg via INTRAVENOUS
  Filled 2013-12-22 (×4): qty 40

## 2013-12-22 MED ORDER — PHENYLEPHRINE HCL 10 MG/ML IJ SOLN
0.0000 ug/min | INTRAMUSCULAR | Status: DC
  Administered 2013-12-22: 30 ug/min via INTRAVENOUS
  Administered 2013-12-22 – 2013-12-23 (×2): 150 ug/min via INTRAVENOUS
  Administered 2013-12-23 (×4): 225 ug/min via INTRAVENOUS
  Administered 2013-12-23: 150 ug/min via INTRAVENOUS
  Filled 2013-12-22 (×11): qty 2

## 2013-12-22 MED ORDER — ARTIFICIAL TEARS OP OINT
TOPICAL_OINTMENT | OPHTHALMIC | Status: DC | PRN
  Administered 2013-12-22: 1 via OPHTHALMIC

## 2013-12-22 MED ORDER — CEFAZOLIN SODIUM-DEXTROSE 2-3 GM-% IV SOLR
INTRAVENOUS | Status: DC | PRN
  Administered 2013-12-22 (×2): 2 g via INTRAVENOUS

## 2013-12-22 MED ORDER — TRANEXAMIC ACID 100 MG/ML IV SOLN
1000.0000 mg | Freq: Once | INTRAVENOUS | Status: AC
  Administered 2013-12-22: 1000 mg via INTRAVENOUS
  Filled 2013-12-22: qty 10

## 2013-12-22 MED ORDER — COAGULATION FACTOR VIIA RECOMB 1 MG IV SOLR
90.0000 ug/kg | INTRAVENOUS | Status: AC
Start: 2013-12-22 — End: 2013-12-22
  Administered 2013-12-22: 11000 ug via INTRAVENOUS
  Filled 2013-12-22: qty 11

## 2013-12-22 MED ORDER — SODIUM CHLORIDE 0.9 % IR SOLN
Status: DC | PRN
  Administered 2013-12-22: 06:00:00

## 2013-12-22 MED ORDER — ATROPINE SULFATE 1 MG/ML IJ SOLN
INTRAMUSCULAR | Status: AC | PRN
  Administered 2013-12-22: 1 mg via INTRAVENOUS

## 2013-12-22 MED ORDER — MIDAZOLAM HCL 2 MG/2ML IJ SOLN: 2.0000 mg | Freq: Once | INTRAMUSCULAR | Status: DC

## 2013-12-22 MED ORDER — INSULIN ASPART 100 UNIT/ML ~~LOC~~ SOLN
0.0000 [IU] | SUBCUTANEOUS | Status: DC
  Administered 2013-12-22 (×2): 2 [IU] via SUBCUTANEOUS
  Administered 2013-12-23: 1 [IU] via SUBCUTANEOUS
  Filled 2013-12-22 (×52): qty 0.09

## 2013-12-22 MED ORDER — CHLORHEXIDINE GLUCONATE 0.12 % MT SOLN
15.0000 mL | Freq: Two times a day (BID) | OROMUCOSAL | Status: DC
  Administered 2013-12-22 – 2013-12-23 (×3): 15 mL via OROMUCOSAL
  Filled 2013-12-22 (×6): qty 15

## 2013-12-22 MED ORDER — SODIUM CHLORIDE 0.9 % IV BOLUS (SEPSIS): 1000.0000 mL | Freq: Once | INTRAVENOUS | Status: DC

## 2013-12-22 MED ORDER — MIDAZOLAM HCL 5 MG/5ML IJ SOLN
INTRAMUSCULAR | Status: DC | PRN
  Administered 2013-12-22 (×4): 2 mg via INTRAVENOUS

## 2013-12-22 MED ORDER — SODIUM BICARBONATE 8.4 % IV SOLN
100.0000 meq | Freq: Once | INTRAVENOUS | Status: AC
  Administered 2013-12-22: 100 meq via INTRAVENOUS
  Filled 2013-12-22 (×2): qty 100

## 2013-12-22 MED ORDER — HYDROCORTISONE NA SUCCINATE PF 100 MG IJ SOLR
INTRAMUSCULAR | Status: AC
  Filled 2013-12-22: qty 2

## 2013-12-22 MED ORDER — INSULIN ASPART 100 UNIT/ML ~~LOC~~ SOLN
SUBCUTANEOUS | Status: AC
  Filled 2013-12-22: qty 10

## 2013-12-22 MED ORDER — SODIUM CHLORIDE 0.9 % IV SOLN
INTRAVENOUS | Status: DC
  Administered 2013-12-22: 09:00:00 via INTRAVENOUS

## 2013-12-22 MED ORDER — ALBUMIN HUMAN 5 % IV SOLN
25.0000 g | Freq: Once | INTRAVENOUS | Status: AC
  Administered 2013-12-22: 25 g via INTRAVENOUS
  Filled 2013-12-22 (×2): qty 500

## 2013-12-22 MED ORDER — SODIUM CHLORIDE 0.9 % IV SOLN
INTRAVENOUS | Status: DC | PRN
  Administered 2013-12-22: 04:00:00 via INTRAVENOUS

## 2013-12-22 MED ORDER — IPRATROPIUM-ALBUTEROL 0.5-2.5 (3) MG/3ML IN SOLN
3.0000 mL | RESPIRATORY_TRACT | Status: DC
  Administered 2013-12-22 – 2013-12-24 (×11): 3 mL via RESPIRATORY_TRACT
  Filled 2013-12-22 (×11): qty 3

## 2013-12-22 SURGICAL SUPPLY — 84 items
BANDAGE HEMOSTAT MRDH 4X4 STRL (MISCELLANEOUS) ×6 IMPLANT
BLADE 10 SAFETY STRL DISP (BLADE) ×5 IMPLANT
BLADE SURG ROTATE 9660 (MISCELLANEOUS) IMPLANT
BNDG HEMOSTAT MRDH 4X4 STRL (MISCELLANEOUS) ×10
CANISTER SUCTION 2500CC (MISCELLANEOUS) ×5 IMPLANT
CANISTER WOUND CARE 500ML ATS (WOUND CARE) ×10 IMPLANT
CATH THORACIC 36FR (CATHETERS) ×5 IMPLANT
CHLORAPREP W/TINT 26ML (MISCELLANEOUS) IMPLANT
CLIP TI MEDIUM 24 (CLIP) ×5 IMPLANT
CLIP TI WIDE RED SMALL 24 (CLIP) ×5 IMPLANT
COVER MAYO STAND STRL (DRAPES) IMPLANT
COVER SURGICAL LIGHT HANDLE (MISCELLANEOUS) ×5 IMPLANT
DRAPE LAPAROSCOPIC ABDOMINAL (DRAPES) ×5 IMPLANT
DRAPE PROXIMA HALF (DRAPES) IMPLANT
DRAPE UTILITY 15X26 W/TAPE STR (DRAPE) ×10 IMPLANT
DRAPE WARM FLUID 44X44 (DRAPE) ×5 IMPLANT
DRSG OPSITE POSTOP 4X10 (GAUZE/BANDAGES/DRESSINGS) IMPLANT
DRSG OPSITE POSTOP 4X8 (GAUZE/BANDAGES/DRESSINGS) IMPLANT
DRSG VAC ATS LRG SENSATRAC (GAUZE/BANDAGES/DRESSINGS) ×5 IMPLANT
ELECT BLADE 6.5 EXT (BLADE) ×5 IMPLANT
ELECT CAUTERY BLADE 6.4 (BLADE) ×5 IMPLANT
ELECT REM PT RETURN 9FT ADLT (ELECTROSURGICAL) ×5
ELECTRODE REM PT RTRN 9FT ADLT (ELECTROSURGICAL) ×3 IMPLANT
GLOVE BIO SURGEON STRL SZ7 (GLOVE) ×5 IMPLANT
GLOVE BIOGEL PI IND STRL 7.0 (GLOVE) ×6 IMPLANT
GLOVE BIOGEL PI IND STRL 7.5 (GLOVE) ×18 IMPLANT
GLOVE BIOGEL PI IND STRL 8 (GLOVE) ×3 IMPLANT
GLOVE BIOGEL PI INDICATOR 7.0 (GLOVE) ×4
GLOVE BIOGEL PI INDICATOR 7.5 (GLOVE) ×12
GLOVE BIOGEL PI INDICATOR 8 (GLOVE) ×2
GLOVE ECLIPSE 7.5 STRL STRAW (GLOVE) ×5 IMPLANT
GLOVE SS BIOGEL STRL SZ 6.5 (GLOVE) ×3 IMPLANT
GLOVE SUPERSENSE BIOGEL SZ 6.5 (GLOVE) ×2
GLOVE SURG SS PI 7.5 STRL IVOR (GLOVE) ×10 IMPLANT
GOWN STRL REUS W/ TWL LRG LVL3 (GOWN DISPOSABLE) ×9 IMPLANT
GOWN STRL REUS W/TWL LRG LVL3 (GOWN DISPOSABLE) ×6
KIT BASIN OR (CUSTOM PROCEDURE TRAY) ×5 IMPLANT
KIT ROOM TURNOVER OR (KITS) ×5 IMPLANT
LIGASURE IMPACT 36 18CM CVD LR (INSTRUMENTS) ×5 IMPLANT
NS IRRIG 1000ML POUR BTL (IV SOLUTION) ×10 IMPLANT
PACK AORTA (CUSTOM PROCEDURE TRAY) ×5 IMPLANT
PACK GENERAL/GYN (CUSTOM PROCEDURE TRAY) ×5 IMPLANT
PAD ARMBOARD 7.5X6 YLW CONV (MISCELLANEOUS) ×5 IMPLANT
PAD NEG PRESSURE SENSATRAC (MISCELLANEOUS) ×5 IMPLANT
PENCIL BUTTON HOLSTER BLD 10FT (ELECTRODE) IMPLANT
RELOAD PROXIMATE 75MM BLUE (ENDOMECHANICALS) ×30 IMPLANT
RELOAD PROXIMATE TA60MM BLUE (ENDOMECHANICALS) ×5 IMPLANT
SEPRAFILM PROCEDURAL PACK 3X5 (MISCELLANEOUS) IMPLANT
SOLUTION BETADINE 4OZ (MISCELLANEOUS) ×5 IMPLANT
SPECIMEN JAR LARGE (MISCELLANEOUS) ×5 IMPLANT
SPONGE ABDOMINAL VAC ABTHERA (MISCELLANEOUS) ×5 IMPLANT
SPONGE GAUZE 4X4 12PLY STER LF (GAUZE/BANDAGES/DRESSINGS) ×10 IMPLANT
SPONGE LAP 18X18 X RAY DECT (DISPOSABLE) ×60 IMPLANT
STAPLER GUN LINEAR PROX 60 (STAPLE) ×5 IMPLANT
STAPLER PROXIMATE 75MM BLUE (STAPLE) ×5 IMPLANT
STAPLER VISISTAT 35W (STAPLE) ×5 IMPLANT
SUCTION POOLE TIP (SUCTIONS) ×5 IMPLANT
SUT NOVA 1 T20/GS 25DT (SUTURE) IMPLANT
SUT PDS AB 1 TP1 96 (SUTURE) ×10 IMPLANT
SUT PROLENE 5 0 CC1 (SUTURE) ×5 IMPLANT
SUT PROLENE 6 0 BV (SUTURE) ×35 IMPLANT
SUT PROLENE 6 0 C 1 30 (SUTURE) ×20 IMPLANT
SUT SILK 0 FSL (SUTURE) ×5 IMPLANT
SUT SILK 2 0 (SUTURE) ×2
SUT SILK 2 0 SH CR/8 (SUTURE) ×5 IMPLANT
SUT SILK 2 0 TIES 10X30 (SUTURE) ×5 IMPLANT
SUT SILK 2-0 18XBRD TIE 12 (SUTURE) ×3 IMPLANT
SUT SILK 3 0 (SUTURE) ×2
SUT SILK 3 0 SH CR/8 (SUTURE) ×5 IMPLANT
SUT SILK 3 0 TIES 10X30 (SUTURE) ×5 IMPLANT
SUT SILK 3-0 18XBRD TIE 12 (SUTURE) ×3 IMPLANT
SUT SILK 4 0 (SUTURE) ×2
SUT SILK 4-0 18XBRD TIE 12 (SUTURE) ×3 IMPLANT
SUT VIC AB 2-0 CT1 27 (SUTURE) ×2
SUT VIC AB 2-0 CT1 TAPERPNT 27 (SUTURE) ×3 IMPLANT
SUT VIC AB 3-0 SH 27 (SUTURE) ×2
SUT VIC AB 3-0 SH 27XBRD (SUTURE) ×3 IMPLANT
SYSTEM SAHARA CHEST DRAIN ATS (WOUND CARE) ×5 IMPLANT
TOWEL OR 17X26 10 PK STRL BLUE (TOWEL DISPOSABLE) ×5 IMPLANT
TRAY CHEST TUBE INSERTION (SET/KITS/TRAYS/PACK) ×5 IMPLANT
TRAY FOLEY CATH 16FRSI W/METER (SET/KITS/TRAYS/PACK) ×5 IMPLANT
TUBE CONNECTING 12'X1/4 (SUCTIONS) ×1
TUBE CONNECTING 12X1/4 (SUCTIONS) ×4 IMPLANT
YANKAUER SUCT BULB TIP NO VENT (SUCTIONS) IMPLANT
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
## 2013-12-22 NOTE — Procedures (Signed)
Central Venous Catheter Insertion Procedure Note Eda PaschalMichael R XXXLovett [id number] February 21, 1981  Procedure: Insertion of Central Venous Catheter Indications: Drug and/or fluid administration  Procedure Details Consent: Unable to obtain consent because of emergent medical necessity. Time Out: Verified patient identification, verified procedure, site/side was marked, verified correct patient position, special equipment/implants available, medications/allergies/relevent history reviewed, required imaging and test results available.  Performed  Maximum sterile technique was used including antiseptics, cap, gloves, gown, hand hygiene, mask and sheet. Skin prep: Chlorhexidine; local anesthetic administered A antimicrobial bonded/coated triple lumen catheter was placed in the left subclavian vein using the Seldinger technique.  Evaluation Blood flow good Complications: No apparent complications Patient did tolerate procedure well. Chest X-ray ordered to verify placement.  CXR: pending.  Devyn Sheerin E 01/11/2014, 9:27 AM

## 2013-12-22 NOTE — Progress Notes (Signed)
Patient ID: Stephen Stephens, male   DOB: 1980-11-17, 33 y.o.   MRN: [id number] Follow up - Trauma Critical Care  Patient Details:    Stephen Stephens is an 33 y.o. male.  Lines/tubes : Airway 8 mm (Active)  Secured at (cm) 24 cm 01/03/2014  8:16 AM  Measured From Lips 12/26/2013  8:16 AM  Secured Location Right 12/19/2013  8:16 AM  Secured By [employer] 12/28/2013  8:16 AM  Tube Holder Repositioned Yes 12/26/2013  8:16 AM     Arterial Line 12/21/2013 Left Radial (Active)     Negative Pressure Wound Therapy Abdomen Medial (Active)     NG/OG Tube Orogastric 18 Fr. Right mouth (Active)     Urethral Catheter C.[place]-RN Latex (Active)  Input (mL) 105 mL 12/15/2013  8:25 AM    Microbiology/Sepsis markers: No results found for this or any previous visit.  Anti-infectives:  Anti-infectives   None      Best Practice/Protocols:  VTE Prophylaxis: Mechanical Continous Sedation  Consults:      Studies:    Events:  Subjective:    Overnight Issues:   Objective:  Vital signs for last 24 hours: Pulse Rate:  [124] 124 (10/08 0816) Resp:  [16-38] 16 (10/08 0816) BP: (90)/(62) 90/62 mmHg (10/08 0311) SpO2:  [92 %] 92 % (10/08 0816) FiO2 (%):  [100 %] 100 % (10/08 0816) Weight:  [280 lb (127.007 kg)] 280 lb (127.007 kg) (10/08 0311)  Hemodynamic parameters for last 24 hours:    Intake/Output from previous day: 10/07 0701 - 10/08 0700 In: 40981 [I.V.:6650; XBJYN:82956; IV Piggyback:250] Out: 350 [Urine:350]  Intake/Output this shift: Total I/O In: 1826 [I.V.:400; Blood:1321; Other:105] Out: 1093 [Other:93; Blood:1000]  Vent settings for last 24 hours: Vent Mode:  [-] PRVC FiO2 (%):  [100 %] 100 % Set Rate:  [16 bmp] 16 bmp Vt Set:  [630 mL-670 mL] 670 mL PEEP:  [5 cmH20] 5 cmH20 Plateau Pressure:  [21 cmH20-28 cmH20] 28 cmH20  Physical Exam:  General: on vent Neuro: sedated HEENT/Neck: ETT and collar Resp: clear to auscultation  bilaterally CVS: tachy 150 GI: open abdomen VAC with some blood pooling Extremities: no deform  Results for orders placed during the hospital encounter of 12/20/2013 (from the past 24 hour(s))  PREPARE FRESH FROZEN PLASMA     Status: None   Collection Time    01/13/2014  3:20 AM      Result Value Ref Range   Unit Number [id number]     Blood Component Type LIQ PLASMA     Unit division 00     Status of Unit ISSUED     Unit tag comment VERBAL ORDERS PER DR NANAVATI     Transfusion Status OK TO TRANSFUSE     Unit Number [id number]     Blood Component Type LIQ PLASMA     Unit division 00     Status of Unit ISSUED     Unit tag comment VERBAL ORDERS PER DR NANAVATI     Transfusion Status OK TO TRANSFUSE     Unit Number [id number]     Blood Component Type THAWED PLASMA     Unit division 00     Status of Unit ISSUED     Unit tag comment VERBAL ORDERS PER DR WYATT     Transfusion Status OK TO TRANSFUSE     Unit Number [id number]     Blood Component Type THW PLS APHR     Unit division 00  Status of Unit ISSUED     Unit tag comment VERBAL ORDERS PER DR WYATT     Transfusion Status OK TO TRANSFUSE     Unit Number [id number]     Blood Component Type THAWED PLASMA     Unit division 00     Status of Unit ISSUED     Transfusion Status OK TO TRANSFUSE     Unit Number [id number]     Blood Component Type THAWED PLASMA     Unit division 00     Status of Unit ISSUED     Transfusion Status OK TO TRANSFUSE     Unit Number [id number]     Blood Component Type THAWED PLASMA     Unit division 00     Status of Unit ISSUED     Transfusion Status OK TO TRANSFUSE     Unit Number [id number]     Blood Component Type THAWED PLASMA     Unit division 00     Status of Unit ISSUED     Transfusion Status OK TO TRANSFUSE     Unit Number [id number]     Blood Component Type THAWED PLASMA     Unit division 00     Status of Unit ISSUED     Transfusion Status OK TO TRANSFUSE      Unit Number [id number]     Blood Component Type THAWED PLASMA     Unit division 00     Status of Unit ISSUED     Transfusion Status OK TO TRANSFUSE     Unit Number [id number]     Blood Component Type THAWED PLASMA     Unit division 00     Status of Unit ISSUED     Transfusion Status OK TO TRANSFUSE     Unit Number [id number]     Blood Component Type THAWED PLASMA     Unit division 00     Status of Unit ISSUED     Transfusion Status OK TO TRANSFUSE     Unit Number [id number]     Blood Component Type THAWED PLASMA     Unit division 00     Status of Unit ISSUED     Transfusion Status OK TO TRANSFUSE     Unit Number [id number]     Blood Component Type THAWED PLASMA     Unit division 00     Status of Unit ISSUED     Transfusion Status OK TO TRANSFUSE     Unit Number [id number]     Blood Component Type THAWED PLASMA     Unit division 00     Status of Unit ISSUED     Transfusion Status OK TO TRANSFUSE     Unit Number [id number]     Blood Component Type THAWED PLASMA     Unit division 00     Status of Unit ISSUED     Transfusion Status OK TO TRANSFUSE     Unit Number [id number]     Blood Component Type THAWED PLASMA     Unit division 00     Status of Unit ISSUED     Transfusion Status OK TO TRANSFUSE     Unit Number [id number]     Blood Component Type THAWED PLASMA     Unit division 00     Status of Unit ISSUED     Transfusion Status OK TO TRANSFUSE     Unit Number [id number]     Blood Component Type  THAWED PLASMA     Unit division 00     Status of Unit ISSUED     Transfusion Status OK TO TRANSFUSE     Unit Number [id number]     Blood Component Type THAWED PLASMA     Unit division 00     Status of Unit ISSUED     Transfusion Status OK TO TRANSFUSE     Unit Number [id number]     Blood Component Type THAWED PLASMA     Unit division 00     Status of Unit ISSUED     Transfusion Status OK TO TRANSFUSE     Unit Number [id number]      Blood Component Type THAWED PLASMA     Unit division 00     Status of Unit ISSUED     Transfusion Status OK TO TRANSFUSE     Unit Number [id number]     Blood Component Type THAWED PLASMA     Unit division 00     Status of Unit ISSUED     Transfusion Status OK TO TRANSFUSE     Unit Number [id number]     Blood Component Type THAWED PLASMA     Unit division 00     Status of Unit ALLOCATED     Transfusion Status OK TO TRANSFUSE     Unit Number [id number]     Blood Component Type THAWED PLASMA     Unit division 00     Status of Unit ALLOCATED     Transfusion Status OK TO TRANSFUSE     Unit Number [id number]     Blood Component Type THAWED PLASMA     Unit division 00     Status of Unit ALLOCATED     Transfusion Status OK TO TRANSFUSE     Unit Number [id number]     Blood Component Type THW PLS APHR     Unit division 00     Status of Unit ALLOCATED     Transfusion Status OK TO TRANSFUSE    MASSIVE TRANSFUSION PROTOCOL ORDER (BLOOD BANK NOTIFICATION)     Status: None   Collection Time    01/01/2014  3:22 AM      Result Value Ref Range   Initiate Massive Transfusion Protocol MTP ORDER RECEIVED    TYPE AND SCREEN     Status: None   Collection Time    12/28/2013  3:23 AM      Result Value Ref Range   ABO/RH(D) O POS     Antibody Screen NEG     Sample Expiration 12/25/2013     Unit Number [id number]     Blood Component Type RED CELLS,LR     Unit division 00     Status of Unit ISSUED     Unit tag comment VERBAL ORDERS PER DR NANAVATI     Transfusion Status OK TO TRANSFUSE     Crossmatch Result COMPATIBLE     Unit Number [id number]     Blood Component Type RED CELLS,LR     Unit division 00     Status of Unit ISSUED     Unit tag comment VERBAL ORDERS PER DR NANAVATI     Transfusion Status OK TO TRANSFUSE     Crossmatch Result COMPATIBLE     Unit Number [id number]     Blood Component Type RED CELLS,LR     Unit division 00     Status of Unit ISSUED      Unit tag  comment VERBAL ORDERS PER DR WYATT     Transfusion Status OK TO TRANSFUSE     Crossmatch Result COMPATIBLE     Unit Number [id number]     Blood Component Type RED CELLS,LR     Unit division 00     Status of Unit ISSUED     Unit tag comment VERBAL ORDERS PER DR WYATT     Transfusion Status OK TO TRANSFUSE     Crossmatch Result COMPATIBLE     Unit Number [id number]     Blood Component Type RED CELLS,LR     Unit division 00     Status of Unit REL FROM [facility]     Unit tag comment VERBAL ORDERS PER DR WYATT     Transfusion Status OK TO TRANSFUSE     Crossmatch Result COMPATIBLE     Unit Number [id number]     Blood Component Type RED CELLS,LR     Unit division 00     Status of Unit REL FROM [facility]     Unit tag comment VERBAL ORDERS PER DR WYATT     Transfusion Status OK TO TRANSFUSE     Crossmatch Result COMPATIBLE     Unit Number [id number]     Blood Component Type RBC LR PHER2     Unit division 00     Status of Unit ISSUED     Unit tag comment VERBAL ORDERS PER DR WYATT     Transfusion Status OK TO TRANSFUSE     Crossmatch Result COMPATIBLE     Unit Number [id number]     Blood Component Type RED CELLS,LR     Unit division 00     Status of Unit ISSUED     Unit tag comment VERBAL ORDERS PER DR WYATT     Transfusion Status OK TO TRANSFUSE     Crossmatch Result COMPATIBLE     Unit Number [id number]     Blood Component Type RED CELLS,LR     Unit division 00     Status of Unit ISSUED     Unit tag comment VERBAL ORDERS PER DR WYATT     Transfusion Status OK TO TRANSFUSE     Crossmatch Result COMPATIBLE     Unit Number [id number]     Blood Component Type RBC LR PHER1     Unit division 00     Status of Unit ISSUED     Unit tag comment VERBAL ORDERS PER DR WYATT     Transfusion Status OK TO TRANSFUSE     Crossmatch Result COMPATIBLE     Unit Number [id number]     Blood Component Type RBC LR PHER2     Unit division 00     Status of Unit ISSUED      Unit tag comment VERBAL ORDERS PER DR WYATT     Transfusion Status OK TO TRANSFUSE     Crossmatch Result COMPATIBLE     Unit Number [id number]     Blood Component Type RBC LR PHER2     Unit division 00     Status of Unit ISSUED     Unit tag comment VERBAL ORDERS PER DR WYATT     Transfusion Status OK TO TRANSFUSE     Crossmatch Result COMPATIBLE     Unit Number [id number]     Blood Component Type RED CELLS,LR     Unit division 00     Status of Unit ISSUED     Transfusion Status OK  TO TRANSFUSE     Crossmatch Result Compatible     Unit Number [id number]     Blood Component Type RED CELLS,LR     Unit division 00     Status of Unit ISSUED     Transfusion Status OK TO TRANSFUSE     Crossmatch Result Compatible     Unit Number [id number]     Blood Component Type RED CELLS,LR     Unit division 00     Status of Unit ISSUED     Transfusion Status OK TO TRANSFUSE     Crossmatch Result Compatible     Unit Number [id number]     Blood Component Type RED CELLS,LR     Unit division 00     Status of Unit ISSUED     Transfusion Status OK TO TRANSFUSE     Crossmatch Result Compatible     Unit Number [id number]     Blood Component Type RED CELLS,LR     Unit division 00     Status of Unit ISSUED     Transfusion Status OK TO TRANSFUSE     Crossmatch Result Compatible     Unit Number [id number]     Blood Component Type RED CELLS,LR     Unit division 00     Status of Unit ISSUED     Transfusion Status OK TO TRANSFUSE     Crossmatch Result Compatible     Unit Number [id number]     Blood Component Type RED CELLS,LR     Unit division 00     Status of Unit ISSUED     Transfusion Status OK TO TRANSFUSE     Crossmatch Result Compatible     Unit Number [id number]     Blood Component Type RED CELLS,LR     Unit division 00     Status of Unit ISSUED     Transfusion Status OK TO TRANSFUSE     Crossmatch Result Compatible     Unit Number [id number]     Blood  Component Type RED CELLS,LR     Unit division 00     Status of Unit ISSUED     Transfusion Status OK TO TRANSFUSE     Crossmatch Result Compatible     Unit Number [id number]     Blood Component Type RED CELLS,LR     Unit division 00     Status of Unit ISSUED     Transfusion Status OK TO TRANSFUSE     Crossmatch Result Compatible     Unit Number [id number]     Blood Component Type RED CELLS,LR     Unit division 00     Status of Unit ISSUED     Transfusion Status OK TO TRANSFUSE     Crossmatch Result Compatible     Unit Number [id number]     Blood Component Type RED CELLS,LR     Unit division 00     Status of Unit ISSUED     Transfusion Status OK TO TRANSFUSE     Crossmatch Result Compatible     Unit Number [id number]     Blood Component Type RED CELLS,LR     Unit division 00     Status of Unit ISSUED     Transfusion Status OK TO TRANSFUSE     Crossmatch Result Compatible     Unit Number [id number]     Blood Component Type RED CELLS,LR     Unit division 00  Status of Unit ISSUED     Transfusion Status OK TO TRANSFUSE     Crossmatch Result Compatible     Unit Number [id number]     Blood Component Type RED CELLS,LR     Unit division 00     Status of Unit ISSUED     Transfusion Status OK TO TRANSFUSE     Crossmatch Result Compatible     Unit Number [id number]     Blood Component Type RED CELLS,LR     Unit division 00     Status of Unit ISSUED     Transfusion Status OK TO TRANSFUSE     Crossmatch Result Compatible     Unit Number [id number]     Blood Component Type RED CELLS,LR     Unit division 00     Status of Unit ISSUED     Transfusion Status OK TO TRANSFUSE     Crossmatch Result Compatible     Unit Number [id number]     Blood Component Type RBC LR PHER2     Unit division 00     Status of Unit ISSUED     Transfusion Status OK TO TRANSFUSE     Crossmatch Result Compatible     Unit Number [id number]     Blood Component Type RBC  LR PHER1     Unit division 00     Status of Unit ISSUED     Transfusion Status OK TO TRANSFUSE     Crossmatch Result Compatible     Unit Number [id number]     Blood Component Type RBC LR PHER1     Unit division 00     Status of Unit ISSUED     Transfusion Status OK TO TRANSFUSE     Crossmatch Result Compatible     Unit Number [id number]     Blood Component Type RED CELLS,LR     Unit division 00     Status of Unit ALLOCATED     Transfusion Status OK TO TRANSFUSE     Crossmatch Result Compatible     Unit Number [id number]     Blood Component Type RED CELLS,LR     Unit division 00     Status of Unit ALLOCATED     Transfusion Status OK TO TRANSFUSE     Crossmatch Result Compatible     Unit Number [id number]     Blood Component Type RBC LR PHER2     Unit division 00     Status of Unit ALLOCATED     Transfusion Status OK TO TRANSFUSE     Crossmatch Result Compatible     Unit Number [id number]     Blood Component Type RED CELLS,LR     Unit division 00     Status of Unit ALLOCATED     Transfusion Status OK TO TRANSFUSE     Crossmatch Result Compatible    CDS SEROLOGY     Status: None   Collection Time    12/19/2013  3:23 AM      Result Value Ref Range   CDS serology specimen       Value: SPECIMEN WILL BE HELD FOR 14 DAYS IF TESTING IS REQUIRED  COMPREHENSIVE METABOLIC PANEL     Status: Abnormal   Collection Time    12/23/2013  3:23 AM      Result Value Ref Range   Sodium 150 (*) 137 - 147 mEq/L   Potassium 4.4  3.7 - 5.3 mEq/L   Chloride  106  96 - 112 mEq/L   CO2 12 (*) 19 - 32 mEq/L   Glucose, Bld 174 (*) 70 - 99 mg/dL   BUN 17  6 - 23 mg/dL   Creatinine, Ser 0.96 (*) 0.50 - 1.35 mg/dL   Calcium 9.5  8.4 - 04.5 mg/dL   Total Protein 5.4 (*) 6.0 - 8.3 g/dL   Albumin 2.8 (*) 3.5 - 5.2 g/dL   AST 19  0 - 37 U/L   ALT 19  0 - 53 U/L   Alkaline Phosphatase 74  39 - 117 U/L   Total Bilirubin 0.2 (*) 0.3 - 1.2 mg/dL   GFR calc non Af Amer 37 (*) >90 mL/min    GFR calc Af Amer 42 (*) >90 mL/min   Anion gap 32 (*) 5 - 15  CBC     Status: Abnormal   Collection Time    Dec 27, 2013  3:23 AM      Result Value Ref Range   WBC 3.5 (*) 4.0 - 10.5 K/uL   RBC 3.79 (*) 4.22 - 5.81 MIL/uL   Hemoglobin 10.6 (*) 13.0 - 17.0 g/dL   HCT 40.9 (*) 81.1 - 91.4 %   MCV 90.5  78.0 - 100.0 fL   MCH 28.0  26.0 - 34.0 pg   MCHC 30.9  30.0 - 36.0 g/dL   RDW 78.2  95.6 - 21.3 %   Platelets 184  150 - 400 K/uL  ETHANOL     Status: Abnormal   Collection Time    December 27, 2013  3:23 AM      Result Value Ref Range   Alcohol, Ethyl (B) 78 (*) 0 - 11 mg/dL  PROTIME-INR     Status: Abnormal   Collection Time    12/26/2013  3:23 AM      Result Value Ref Range   Prothrombin Time 17.0 (*) 11.6 - 15.2 seconds   INR 1.38  0.00 - 1.49  ABO/RH     Status: None   Collection Time    01/04/2014  3:23 AM      Result Value Ref Range   ABO/RH(D) O POS    I-STAT TROPOININ, ED     Status: None   Collection Time    Dec 27, 2013  3:33 AM      Result Value Ref Range   Troponin i, poc 0.04  0.00 - 0.08 ng/mL   Comment 3           I-STAT CHEM 8, ED     Status: Abnormal   Collection Time    12/15/2013  3:34 AM      Result Value Ref Range   Sodium 145  137 - 147 mEq/L   Potassium 4.1  3.7 - 5.3 mEq/L   Chloride 109  96 - 112 mEq/L   BUN 16  6 - 23 mg/dL   Creatinine, Ser 0.86 (*) 0.50 - 1.35 mg/dL   Glucose, Bld 578 (*) 70 - 99 mg/dL   Calcium, Ion 4.69  6.29 - 1.23 mmol/L   TCO2 14  0 - 100 mmol/L   Hemoglobin 11.2 (*) 13.0 - 17.0 g/dL   HCT 52.8 (*) 41.3 - 24.4 %  I-STAT CG4 LACTIC ACID, ED     Status: Abnormal   Collection Time    Dec 27, 2013  3:35 AM      Result Value Ref Range   Lactic Acid, Venous 15.88 (*) 0.5 - 2.2 mmol/L  PREPARE PLATELET PHERESIS     Status:  None   Collection Time    12/17/2013  3:38 AM      Result Value Ref Range   Unit Number [id number]     Blood Component Type PLTP LR2 PAS     Unit division 00     Status of Unit ISSUED     Unit tag comment VERBAL ORDERS  PER DR WYATT     Transfusion Status OK TO TRANSFUSE     Unit Number [id number]     Blood Component Type PLTPHER LR1     Unit division 00     Status of Unit ISSUED     Transfusion Status OK TO TRANSFUSE    POCT I-STAT 7, (LYTES, BLD GAS, ICA,H+H)     Status: Abnormal   Collection Time    12/21/2013  4:04 AM      Result Value Ref Range   pH, Arterial 6.739 (*) 7.350 - 7.450   pCO2 arterial 55.2 (*) 35.0 - 45.0 mmHg   pO2, Arterial 366.0 (*) 80.0 - 100.0 mmHg   Bicarbonate 7.9 (*) 20.0 - 24.0 mEq/L   TCO2 10  0 - 100 mmol/L   O2 Saturation 100.0     Acid-base deficit 26.0 (*) 0.0 - 2.0 mmol/L   Sodium 140  137 - 147 mEq/L   Potassium 5.4 (*) 3.7 - 5.3 mEq/L   Calcium, Ion 0.60 (*) 1.12 - 1.23 mmol/L   HCT 19.0 (*) 39.0 - 52.0 %   Hemoglobin 6.5 (*) 13.0 - 17.0 g/dL   Patient temperature 84.1 C     Collection site BRACHIAL ARTERY     Sample type ARTERIAL     Comment NOTIFIED PHYSICIAN    POCT I-STAT 7, (LYTES, BLD GAS, ICA,H+H)     Status: Abnormal   Collection Time    01/01/2014  4:21 AM      Result Value Ref Range   pH, Arterial 6.984 (*) 7.350 - 7.450   pCO2 arterial 55.5 (*) 35.0 - 45.0 mmHg   pO2, Arterial 348.0 (*) 80.0 - 100.0 mmHg   Bicarbonate 13.7 (*) 20.0 - 24.0 mEq/L   TCO2 16  0 - 100 mmol/L   O2 Saturation 100.0     Acid-base deficit 17.0 (*) 0.0 - 2.0 mmol/L   Sodium 140  137 - 147 mEq/L   Potassium 6.4 (*) 3.7 - 5.3 mEq/L   Calcium, Ion 0.89 (*) 1.12 - 1.23 mmol/L   HCT 20.0 (*) 39.0 - 52.0 %   Hemoglobin 6.8 (*) 13.0 - 17.0 g/dL   Patient temperature 32.4 C     Collection site RADIAL, ALLEN'S TEST ACCEPTABLE     Sample type ARTERIAL     Comment NOTIFIED PHYSICIAN    POCT I-STAT 4, (NA,K, GLUC, HGB,HCT)     Status: Abnormal   Collection Time    12/23/2013  4:25 AM      Result Value Ref Range   Sodium 140  137 - 147 mEq/L   Potassium 6.3 (*) 3.7 - 5.3 mEq/L   Glucose, Bld 253 (*) 70 - 99 mg/dL   HCT 40.1 (*) 02.7 - 25.3 %   Hemoglobin 6.5 (*) 13.0 - 17.0  g/dL   Comment NOTIFIED PHYSICIAN    POCT I-STAT 7, (LYTES, BLD GAS, ICA,H+H)     Status: Abnormal   Collection Time    01/13/2014  4:42 AM      Result Value Ref Range   pH, Arterial 7.185 (*) 7.350 - 7.450   pCO2 arterial 42.9  35.0 - 45.0 mmHg   pO2, Arterial 363.0 (*) 80.0 - 100.0 mmHg   Bicarbonate 16.7 (*) 20.0 - 24.0 mEq/L   TCO2 18  0 - 100 mmol/L   O2 Saturation 100.0     Acid-base deficit 11.0 (*) 0.0 - 2.0 mmol/L   Sodium 143  137 - 147 mEq/L   Potassium 5.7 (*) 3.7 - 5.3 mEq/L   Calcium, Ion 0.68 (*) 1.12 - 1.23 mmol/L   HCT 12.0 (*) 39.0 - 52.0 %   Hemoglobin 4.1 (*) 13.0 - 17.0 g/dL   Patient temperature 40.9 C     Collection site BRACHIAL ARTERY     Sample type ARTERIAL     Comment NOTIFIED PHYSICIAN    APTT     Status: Abnormal   Collection Time    12/21/2013  5:10 AM      Result Value Ref Range   aPTT 71 (*) 24 - 37 seconds  PROTIME-INR     Status: Abnormal   Collection Time    01/12/2014  5:10 AM      Result Value Ref Range   Prothrombin Time 23.6 (*) 11.6 - 15.2 seconds   INR 2.10 (*) 0.00 - 1.49  DIC (DISSEMINATED INTRAVASCULAR COAGULATION) PANEL     Status: Abnormal   Collection Time    01/03/2014  5:10 AM      Result Value Ref Range   Prothrombin Time 23.6 (*) 11.6 - 15.2 seconds   INR 2.10 (*) 0.00 - 1.49   aPTT 71 (*) 24 - 37 seconds   Fibrinogen 98 (*) 204 - 475 mg/dL   D-Dimer, Quant <8.11  0.00 - 0.48 ug/mL-FEU   Platelets 53 (*) 150 - 400 K/uL   Smear Review NO SCHISTOCYTES SEEN    CBC     Status: Abnormal   Collection Time    01/01/2014  5:10 AM      Result Value Ref Range   WBC 8.5  4.0 - 10.5 K/uL   RBC 3.49 (*) 4.22 - 5.81 MIL/uL   Hemoglobin 10.4 (*) 13.0 - 17.0 g/dL   HCT 91.4 (*) 78.2 - 95.6 %   MCV 90.0  78.0 - 100.0 fL   MCH 29.8  26.0 - 34.0 pg   MCHC 33.1  30.0 - 36.0 g/dL   RDW 21.3 (*) 08.6 - 57.8 %   Platelets 53 (*) 150 - 400 K/uL  COMPREHENSIVE METABOLIC PANEL     Status: Abnormal   Collection Time    12/30/2013  5:10 AM       Result Value Ref Range   Sodium 148 (*) 137 - 147 mEq/L   Potassium 5.8 (*) 3.7 - 5.3 mEq/L   Chloride 104  96 - 112 mEq/L   CO2 16 (*) 19 - 32 mEq/L   Glucose, Bld 261 (*) 70 - 99 mg/dL   BUN 15  6 - 23 mg/dL   Creatinine, Ser 4.69 (*) 0.50 - 1.35 mg/dL   Calcium 8.5  8.4 - 62.9 mg/dL   Total Protein 2.5 (*) 6.0 - 8.3 g/dL   Albumin 1.3 (*) 3.5 - 5.2 g/dL   AST 528 (*) 0 - 37 U/L   ALT 1028 (*) 0 - 53 U/L   Alkaline Phosphatase 36 (*) 39 - 117 U/L   Total Bilirubin <0.2 (*) 0.3 - 1.2 mg/dL   GFR calc non Af Amer 60 (*) >90 mL/min   GFR calc Af Amer 70 (*) >90 mL/min   Anion gap  28 (*) 5 - 15  POCT I-STAT 7, (LYTES, BLD GAS, ICA,H+H)     Status: Abnormal   Collection Time    12/17/2013  5:11 AM      Result Value Ref Range   pH, Arterial 7.263 (*) 7.350 - 7.450   pCO2 arterial 42.5  35.0 - 45.0 mmHg   pO2, Arterial 454.0 (*) 80.0 - 100.0 mmHg   Bicarbonate 19.7 (*) 20.0 - 24.0 mEq/L   TCO2 21  0 - 100 mmol/L   O2 Saturation 100.0     Acid-base deficit 7.0 (*) 0.0 - 2.0 mmol/L   Sodium 143  137 - 147 mEq/L   Potassium 5.7 (*) 3.7 - 5.3 mEq/L   Calcium, Ion 0.65 (*) 1.12 - 1.23 mmol/L   HCT 26.0 (*) 39.0 - 52.0 %   Hemoglobin 8.8 (*) 13.0 - 17.0 g/dL   Patient temperature 16.1 C     Sample type ARTERIAL     Comment NOTIFIED PHYSICIAN    POCT I-STAT 7, (LYTES, BLD GAS, ICA,H+H)     Status: Abnormal   Collection Time    01/06/2014  5:39 AM      Result Value Ref Range   pH, Arterial 7.354  7.350 - 7.450   pCO2 arterial 38.6  35.0 - 45.0 mmHg   pO2, Arterial 398.0 (*) 80.0 - 100.0 mmHg   Bicarbonate 21.9  20.0 - 24.0 mEq/L   TCO2 23  0 - 100 mmol/L   O2 Saturation 100.0     Acid-base deficit 4.0 (*) 0.0 - 2.0 mmol/L   Sodium 144  137 - 147 mEq/L   Potassium 5.5 (*) 3.7 - 5.3 mEq/L   Calcium, Ion 0.63 (*) 1.12 - 1.23 mmol/L   HCT 25.0 (*) 39.0 - 52.0 %   Hemoglobin 8.5 (*) 13.0 - 17.0 g/dL   Patient temperature 09.6 C     Sample type ARTERIAL     Comment NOTIFIED PHYSICIAN     POCT I-STAT 4, (NA,K, GLUC, HGB,HCT)     Status: Abnormal   Collection Time    12/17/2013  5:44 AM      Result Value Ref Range   Sodium 145  137 - 147 mEq/L   Potassium 5.5 (*) 3.7 - 5.3 mEq/L   Glucose, Bld 241 (*) 70 - 99 mg/dL   HCT 04.5 (*) 40.9 - 81.1 %   Hemoglobin 8.8 (*) 13.0 - 17.0 g/dL  PREPARE CRYOPRECIPITATE     Status: None   Collection Time    12/21/2013  6:21 AM      Result Value Ref Range   Unit Number [id number]     Blood Component Type CRYPOOL THAW     Unit division 00     Status of Unit ISSUED     Transfusion Status OK TO TRANSFUSE    CBC     Status: Abnormal   Collection Time    01/03/2014  8:30 AM      Result Value Ref Range   WBC 3.9 (*) 4.0 - 10.5 K/uL   RBC 4.28  4.22 - 5.81 MIL/uL   Hemoglobin 12.4 (*) 13.0 - 17.0 g/dL   HCT 86.5 (*) 78.4 - 69.6 %   MCV 84.3  78.0 - 100.0 fL   MCH 29.0  26.0 - 34.0 pg   MCHC 34.3  30.0 - 36.0 g/dL   RDW 29.5  28.4 - 13.2 %   Platelets 58 (*) 150 - 400 K/uL  PROTIME-INR  Status: Abnormal   Collection Time    01/11/2014  8:30 AM      Result Value Ref Range   Prothrombin Time 17.9 (*) 11.6 - 15.2 seconds   INR 1.48  0.00 - 1.49  POCT I-STAT 3, ART BLOOD GAS (G3+)     Status: Abnormal   Collection Time    12/20/2013  9:06 AM      Result Value Ref Range   pH,

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
## 2013-12-22 NOTE — ED Notes (Signed)
Per Dr. Lindie SpruceWyatt do not perform compressions at this time, get the pt upstairs.
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
## 2013-12-22 NOTE — ED Notes (Signed)
Pulses returned, per EDP stop compressions.
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
## 2013-12-22 NOTE — ED Notes (Signed)
RN holding blood high while running to OR.

## 2013-12-22 NOTE — Anesthesia Postprocedure Evaluation (Signed)
  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 CHEST TUBE INSERTION (Left)  Patient Location: ICU  Anesthesia Type:General  Level of Consciousness: sedated, unresponsive and Patient remains intubated per anesthesia plan  Airway and Oxygen Therapy: Patient placed on Ventilator (see vital sign flow sheet for setting)  Post-op Pain: none  Post-op Assessment: Post-op Vital signs reviewed, Patient's Cardiovascular Status Stable and RESPIRATORY FUNCTION UNSTABLE  Post-op Vital Signs: Reviewed  Last Vitals:  Filed Vitals:   12/15/2013 0324  BP:   Resp: 38    Complications: No apparent anesthesia complications

## 2013-12-22 NOTE — Transfer of Care (Signed)
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 CHEST TUBE INSERTION (Left)  Patient Location: ICU  Anesthesia Type:General  Level of Consciousness: unresponsive  Airway & Oxygen Therapy: Patient remains intubated per anesthesia plan  Post-op Assessment: Report given to PACU RN and Post -op Vital signs reviewed and stable  Post vital signs: Reviewed and stable  Complications: No apparent anesthesia complications

## 2013-12-22 NOTE — ED Notes (Signed)
OR called and informed on way with pt to OR.

## 2013-12-22 NOTE — Progress Notes (Signed)
Patient ID: Stephen Stephens, male   DOB: 03-May-1980, 33 y.o.   MRN: [id number] Oxygenation continues to improve. Pressor requirements lessening after bicarb and 1u PRBC. 2nd unit infusing. Plan return to OR tomorrow. Patient examined and I agree with the assessment and plan  Violeta GelinasBurke Nicolaas Savo, MD, MPH, FACS Trauma: [phone] General Surgery: [phone]  01/11/2014 4:52 PM

## 2013-12-22 NOTE — Anesthesia Postprocedure Evaluation (Signed)
  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 CHEST TUBE INSERTION (Left)  Patient Location: SICU  Anesthesia Type:General  Level of Consciousness: sedated and unresponsive  Airway and Oxygen Therapy: Patient remains intubated per anesthesia plan and Patient placed on Ventilator (see vital sign flow sheet for setting)  Post-op Pain: none  Post-op Assessment: Post-op Vital signs reviewed, Patient's Cardiovascular Status Stable and RESPIRATORY FUNCTION UNSTABLE  Post-op Vital Signs: Reviewed  Last Vitals:  Filed Vitals:   12/31/2013 0816  BP:   Pulse: 124  Resp: 16    Complications: No apparent anesthesia complications
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
## 2013-12-22 NOTE — Progress Notes (Signed)
UR completed.  Kaelani Kendrick, RN BSN [facility] CCM Trauma/Neuro ICU Case Manager [phone]  

## 2013-12-22 NOTE — Anesthesia Procedure Notes (Signed)
Date/Time: 12/25/2013 3:36 AM Performed by: Elizbeth SquiresHASE, Delynn Olvera R Intubation Type: Inhalational induction with existing ETT Placement Confirmation: positive ETCO2 and breath sounds checked- equal and bilateral

## 2013-12-22 NOTE — ED Notes (Signed)
No longer able to obtain manual BPs.

## 2013-12-22 NOTE — OR Nursing (Signed)
Incorrect count- policy and procedure followed, xray taken in OR, Dr. Llana AlimentEntrikin read report. Dr. Lindie SpruceWyatt aware of results.
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
## 2013-12-22 NOTE — ED Notes (Signed)
Blood bank called and informed of massive transfusion protocol.

## 2013-12-22 NOTE — ED Notes (Signed)
I stat results given to Diona FoleyMorgan Brown RN, patient already transported to OR

## 2013-12-22 NOTE — Progress Notes (Signed)
RT called to OR to bring vent for transport. When I arrived, MD stated that PO2 was 38. Patient placed on PRVC, 650, 16, 100%, +14. SATS as low as 50% at times during transport. MD aware.

## 2013-12-22 NOTE — Progress Notes (Addendum)
ET tube exchanged with new size 8 tube due to a tear in the cuff. BBS clear and equal, pt getting all return volumes on the ventilator, chest xray ordered by RN.   Peep decreased from 12 to 10 both done after speaking with trauma MD on call

## 2013-12-23 ENCOUNTER — Inpatient Hospital Stay (HOSPITAL_COMMUNITY): Payer: BC Managed Care – PPO

## 2013-12-23 ENCOUNTER — Encounter (HOSPITAL_COMMUNITY): Admission: EM | Disposition: E | Payer: Self-pay | Source: Home / Self Care

## 2013-12-23 ENCOUNTER — Inpatient Hospital Stay (HOSPITAL_COMMUNITY): Payer: BC Managed Care – PPO | Admitting: Anesthesiology

## 2013-12-23 ENCOUNTER — Encounter (HOSPITAL_COMMUNITY): Payer: Self-pay | Admitting: General Surgery

## 2013-12-23 ENCOUNTER — Encounter (HOSPITAL_COMMUNITY): Payer: BC Managed Care – PPO | Admitting: Anesthesiology

## 2013-12-23 DIAGNOSIS — R6520 Severe sepsis without septic shock: Secondary | ICD-10-CM

## 2013-12-23 DIAGNOSIS — A419 Sepsis, unspecified organism: Secondary | ICD-10-CM

## 2013-12-23 DIAGNOSIS — R571 Hypovolemic shock: Secondary | ICD-10-CM

## 2013-12-23 DIAGNOSIS — J9601 Acute respiratory failure with hypoxia: Secondary | ICD-10-CM

## 2013-12-23 DIAGNOSIS — K659 Peritonitis, unspecified: Secondary | ICD-10-CM

## 2013-12-23 HISTORY — PX: BOWEL RESECTION: SHX1257

## 2013-12-23 HISTORY — PX: VACUUM ASSISTED CLOSURE CHANGE: SHX5227

## 2013-12-23 LAB — PREPARE PLATELET PHERESIS
UNIT DIVISION: 0
Unit division: 0

## 2013-12-23 LAB — PREPARE FRESH FROZEN PLASMA
UNIT DIVISION: 0
UNIT DIVISION: 0
UNIT DIVISION: 0
UNIT DIVISION: 0
UNIT DIVISION: 0
UNIT DIVISION: 0
UNIT DIVISION: 0
UNIT DIVISION: 0
UNIT DIVISION: 0
UNIT DIVISION: 0
Unit division: 0
Unit division: 0
Unit division: 0
Unit division: 0
Unit division: 0
Unit division: 0
Unit division: 0
Unit division: 0
Unit division: 0
Unit division: 0
Unit division: 0
Unit division: 0
Unit division: 0
Unit division: 0
Unit division: 0
Unit division: 0
Unit division: 0

## 2013-12-23 LAB — PREPARE RBC (CROSSMATCH)

## 2013-12-23 LAB — GLUCOSE, CAPILLARY
GLUCOSE-CAPILLARY: 97 mg/dL (ref 70–99)
Glucose-Capillary: 99 mg/dL (ref 70–99)
Glucose-Capillary: 104 mg/dL — ABNORMAL HIGH (ref 70–99)
Glucose-Capillary: 107 mg/dL — ABNORMAL HIGH (ref 70–99)

## 2013-12-23 LAB — CBC WITH DIFFERENTIAL/PLATELET
BASOS PCT: 0 % (ref 0–1)
Basophils Absolute: 0 10*3/uL (ref 0.0–0.1)
EOS PCT: 1 % (ref 0–5)
Eosinophils Absolute: 0 10*3/uL (ref 0.0–0.7)
HEMATOCRIT: 22.9 % — AB (ref 39.0–52.0)
Hemoglobin: 8.1 g/dL — ABNORMAL LOW (ref 13.0–17.0)
LYMPHS ABS: 1.1 10*3/uL (ref 0.7–4.0)
Lymphocytes Relative: 32 % (ref 12–46)
MCH: 29.8 pg (ref 26.0–34.0)
MCHC: 35.4 g/dL (ref 30.0–36.0)
MCV: 84.2 fL (ref 78.0–100.0)
MONO ABS: 0.2 10*3/uL (ref 0.1–1.0)
MONOS PCT: 7 % (ref 3–12)
NEUTROS ABS: 2.1 10*3/uL (ref 1.7–7.7)
Neutrophils Relative %: 60 % (ref 43–77)
Platelets: 105 10*3/uL — ABNORMAL LOW (ref 150–400)
RBC: 2.72 MIL/uL — AB (ref 4.22–5.81)
RDW: 15.8 % — ABNORMAL HIGH (ref 11.5–15.5)
WBC Morphology: INCREASED
WBC: 3.4 10*3/uL — AB (ref 4.0–10.5)

## 2013-12-23 LAB — BLOOD GAS, ARTERIAL
Acid-base deficit: 2.3 mmol/L — ABNORMAL HIGH (ref 0.0–2.0)
Bicarbonate: 21.6 mEq/L (ref 20.0–24.0)
Drawn by: 41308
FIO2: 40 %
LHR: 22 {breaths}/min
MECHVT: 670 mL
O2 Saturation: 97.2 %
PCO2 ART: 35.7 mmHg (ref 35.0–45.0)
PEEP: 10 cmH2O
PH ART: 7.401 (ref 7.350–7.450)
Patient temperature: 99.2
TCO2: 22.7 mmol/L (ref 0–100)
pO2, Arterial: 88.9 mmHg (ref 80.0–100.0)

## 2013-12-23 LAB — CBC
HCT: 38.1 % — ABNORMAL LOW (ref 39.0–52.0)
HEMATOCRIT: 40.8 % (ref 39.0–52.0)
HEMATOCRIT: 28.1 % — AB (ref 39.0–52.0)
Hemoglobin: 13.2 g/dL (ref 13.0–17.0)
Hemoglobin: 14.6 g/dL (ref 13.0–17.0)
Hemoglobin: 9.9 g/dL — ABNORMAL LOW (ref 13.0–17.0)
MCH: 28.8 pg (ref 26.0–34.0)
MCH: 29.4 pg (ref 26.0–34.0)
MCH: 30.2 pg (ref 26.0–34.0)
MCHC: 34.6 g/dL (ref 30.0–36.0)
MCHC: 35.8 g/dL (ref 30.0–36.0)
MCHC: 35.2 g/dL (ref 30.0–36.0)
MCV: 83.2 fL (ref 78.0–100.0)
MCV: 82.1 fL (ref 78.0–100.0)
MCV: 85.7 fL (ref 78.0–100.0)
Platelets: 51 10*3/uL — ABNORMAL LOW (ref 150–400)
Platelets: 44 10*3/uL — ABNORMAL LOW (ref 150–400)
Platelets: 79 10*3/uL — ABNORMAL LOW (ref 150–400)
RBC: 4.58 MIL/uL (ref 4.22–5.81)
RBC: 4.97 MIL/uL (ref 4.22–5.81)
RBC: 3.28 MIL/uL — ABNORMAL LOW (ref 4.22–5.81)
RDW: 15.9 % — AB (ref 11.5–15.5)
RDW: 15.7 % — AB (ref 11.5–15.5)
RDW: 14.8 % (ref 11.5–15.5)
WBC: 11.2 10*3/uL — ABNORMAL HIGH (ref 4.0–10.5)
WBC: 11.9 10*3/uL — ABNORMAL HIGH (ref 4.0–10.5)
WBC: 1.9 10*3/uL — ABNORMAL LOW (ref 4.0–10.5)

## 2013-12-23 LAB — BASIC METABOLIC PANEL
Anion gap: 22 — ABNORMAL HIGH (ref 5–15)
Anion gap: 25 — ABNORMAL HIGH (ref 5–15)
BUN: 33 mg/dL — ABNORMAL HIGH (ref 6–23)
BUN: 35 mg/dL — AB (ref 6–23)
CHLORIDE: 99 meq/L (ref 96–112)
CO2: 20 mEq/L (ref 19–32)
CO2: 17 mEq/L — ABNORMAL LOW (ref 19–32)
CREATININE: 5.6 mg/dL — AB (ref 0.50–1.35)
Calcium: 6.8 mg/dL — ABNORMAL LOW (ref 8.4–10.5)
Calcium: 5.8 mg/dL — CL (ref 8.4–10.5)
Chloride: 105 mEq/L (ref 96–112)
Creatinine, Ser: 4.79 mg/dL — ABNORMAL HIGH (ref 0.50–1.35)
GFR calc Af Amer: 14 mL/min — ABNORMAL LOW (ref >90)
GFR calc non Af Amer: 12 mL/min — ABNORMAL LOW (ref >90)
GFR, EST AFRICAN AMERICAN: 17 mL/min — AB (ref >90)
GFR, EST NON AFRICAN AMERICAN: 14 mL/min — AB (ref >90)
Glucose, Bld: 148 mg/dL — ABNORMAL HIGH (ref 70–99)
Glucose, Bld: 119 mg/dL — ABNORMAL HIGH (ref 70–99)
POTASSIUM: 4 meq/L (ref 3.7–5.3)
Potassium: 3.7 mEq/L (ref 3.7–5.3)
Sodium: 147 mEq/L (ref 137–147)
Sodium: 141 mEq/L (ref 137–147)

## 2013-12-23 LAB — PROTIME-INR
INR: 2.4 — ABNORMAL HIGH (ref 0.00–1.49)
INR: 2.45 — ABNORMAL HIGH (ref 0.00–1.49)
Prothrombin Time: 26.2 seconds — ABNORMAL HIGH (ref 11.6–15.2)
Prothrombin Time: 26.6 seconds — ABNORMAL HIGH (ref 11.6–15.2)

## 2013-12-23 LAB — LACTIC ACID, PLASMA: Lactic Acid, Venous: 10.5 mmol/L — ABNORMAL HIGH (ref 0.5–2.2)

## 2013-12-23 LAB — PREPARE CRYOPRECIPITATE: Unit division: 0

## 2013-12-23 LAB — POCT I-STAT 3, ART BLOOD GAS (G3+)
ACID-BASE DEFICIT: 8 mmol/L — AB (ref 0.0–2.0)
Bicarbonate: 18.2 mEq/L — ABNORMAL LOW (ref 20.0–24.0)
O2 SAT: 88 %
TCO2: 19 mmol/L (ref 0–100)
pCO2 arterial: 40.3 mmHg (ref 35.0–45.0)
pH, Arterial: 7.263 — ABNORMAL LOW (ref 7.350–7.450)
pO2, Arterial: 62 mmHg — ABNORMAL LOW (ref 80.0–100.0)

## 2013-12-23 SURGERY — REPLACEMENT, WOUND VAC DRESSING, ABDOMEN
Anesthesia: General

## 2013-12-23 MED ORDER — ALBUMIN HUMAN 5 % IV SOLN
25.0000 g | Freq: Once | INTRAVENOUS | Status: AC
  Administered 2013-12-23: 25 g via INTRAVENOUS
  Filled 2013-12-23: qty 500

## 2013-12-23 MED ORDER — SODIUM CHLORIDE 0.9 % IV SOLN
1.0000 g | INTRAVENOUS | Status: DC
  Administered 2013-12-23: 1 g via INTRAVENOUS
  Filled 2013-12-23: qty 1

## 2013-12-23 MED ORDER — SODIUM CHLORIDE 0.9 % IV SOLN
1.0000 g | Freq: Once | INTRAVENOUS | Status: AC
  Administered 2013-12-23: 1 g via INTRAVENOUS
  Filled 2013-12-23: qty 10

## 2013-12-23 MED ORDER — SODIUM CHLORIDE 0.9 % IV SOLN: Freq: Once | INTRAVENOUS | Status: DC

## 2013-12-23 MED ORDER — SODIUM CHLORIDE 0.9 % IV SOLN
2500.0000 ug | INTRAVENOUS | Status: DC | PRN
  Administered 2013-12-23: 50 ug/h via INTRAVENOUS

## 2013-12-23 MED ORDER — DEXTROSE 5 % IV SOLN
0.0000 ug/min | INTRAVENOUS | Status: DC
  Administered 2013-12-23: 20 ug/min via INTRAVENOUS
  Administered 2013-12-23: 33 ug/min via INTRAVENOUS
  Administered 2013-12-23: 6 ug/min via INTRAVENOUS
  Filled 2013-12-23: qty 4

## 2013-12-23 MED ORDER — PHENYLEPHRINE HCL 10 MG/ML IJ SOLN
0.0000 ug/min | INTRAVENOUS | Status: DC
  Administered 2013-12-23: 250 ug/min via INTRAVENOUS
  Administered 2013-12-23: 160 ug/min via INTRAVENOUS
  Administered 2013-12-23: 400 ug/min via INTRAVENOUS
  Administered 2013-12-24: 340 ug/min via INTRAVENOUS
  Filled 2013-12-23 (×8): qty 4

## 2013-12-23 MED ORDER — NOREPINEPHRINE BITARTRATE 1 MG/ML IV SOLN
4000.0000 ug | INTRAVENOUS | Status: DC | PRN
  Administered 2013-12-23: 40 ug/min via INTRAVENOUS

## 2013-12-23 MED ORDER — PROPOFOL 10 MG/ML IV BOLUS
INTRAVENOUS | Status: AC
  Filled 2013-12-23: qty 20

## 2013-12-23 MED ORDER — MIDAZOLAM HCL 2 MG/2ML IJ SOLN
INTRAMUSCULAR | Status: AC
  Filled 2013-12-23: qty 2

## 2013-12-23 MED ORDER — SODIUM CHLORIDE 0.9 % IV BOLUS (SEPSIS)
500.0000 mL | Freq: Once | INTRAVENOUS | Status: AC
  Administered 2013-12-23: 500 mL via INTRAVENOUS

## 2013-12-23 MED ORDER — LACTATED RINGERS IV SOLN
INTRAVENOUS | Status: DC | PRN
  Administered 2013-12-23: 16:00:00 via INTRAVENOUS

## 2013-12-23 MED ORDER — 0.9 % SODIUM CHLORIDE (POUR BTL) OPTIME
TOPICAL | Status: DC | PRN
  Administered 2013-12-23 (×10): 1000 mL

## 2013-12-23 MED ORDER — PROPOFOL INFUSION 10 MG/ML OPTIME
INTRAVENOUS | Status: DC | PRN
  Administered 2013-12-23: 50 ug/kg/min via INTRAVENOUS

## 2013-12-23 MED ORDER — FENTANYL CITRATE 0.05 MG/ML IJ SOLN
INTRAMUSCULAR | Status: DC | PRN
  Administered 2013-12-23 (×5): 50 ug via INTRAVENOUS

## 2013-12-23 MED ORDER — PHENYLEPHRINE HCL 10 MG/ML IJ SOLN
10.0000 mg | INTRAVENOUS | Status: DC | PRN
  Administered 2013-12-23: 400 ug/min via INTRAVENOUS

## 2013-12-23 MED ORDER — NOREPINEPHRINE BITARTRATE 1 MG/ML IV SOLN
0.0000 ug/min | INTRAVENOUS | Status: DC
  Administered 2013-12-23: 40 ug/min via INTRAVENOUS
  Filled 2013-12-23 (×3): qty 16

## 2013-12-23 MED ORDER — ROCURONIUM BROMIDE 100 MG/10ML IV SOLN
INTRAVENOUS | Status: DC | PRN
  Administered 2013-12-23: 20 mg via INTRAVENOUS
  Administered 2013-12-23: 30 mg via INTRAVENOUS

## 2013-12-23 MED ORDER — VASOPRESSIN 20 UNIT/ML IJ SOLN
100.0000 [IU] | INTRAVENOUS | Status: DC | PRN
  Administered 2013-12-23: .03 [IU]/min via INTRAVENOUS

## 2013-12-23 MED ORDER — VASOPRESSIN 20 UNIT/ML IJ SOLN
0.0300 [IU]/min | INTRAVENOUS | Status: DC
  Administered 2013-12-23: 0.03 [IU]/min via INTRAVENOUS
  Filled 2013-12-23 (×2): qty 2

## 2013-12-23 MED ORDER — ALBUMIN HUMAN 5 % IV SOLN
INTRAVENOUS | Status: DC | PRN
  Administered 2013-12-23 (×2): via INTRAVENOUS

## 2013-12-23 MED ORDER — SODIUM CHLORIDE 0.9 % IV SOLN
Freq: Once | INTRAVENOUS | Status: AC
  Administered 2013-12-23: 09:00:00 via INTRAVENOUS

## 2013-12-23 MED ORDER — FENTANYL CITRATE 0.05 MG/ML IJ SOLN
INTRAMUSCULAR | Status: AC
  Filled 2013-12-23: qty 5

## 2013-12-23 MED ORDER — MIDAZOLAM HCL 5 MG/5ML IJ SOLN
INTRAMUSCULAR | Status: DC | PRN
  Administered 2013-12-23: 2 mg via INTRAVENOUS

## 2013-12-23 MED ORDER — SODIUM CHLORIDE 0.9 % IV SOLN
Freq: Once | INTRAVENOUS | Status: AC
  Administered 2013-12-23: 03:00:00 via INTRAVENOUS

## 2013-12-23 MED ORDER — SODIUM CHLORIDE 0.9 % IV SOLN
500.0000 mg | INTRAVENOUS | Status: DC
  Filled 2013-12-23: qty 0.5

## 2013-12-23 SURGICAL SUPPLY — 52 items
ABDOMEN DRESSING ×3 IMPLANT
BANDAGE HEMOSTAT MRDH 4X4 STRL (MISCELLANEOUS) ×1 IMPLANT
BENZOIN TINCTURE PRP APPL 2/3 (GAUZE/BANDAGES/DRESSINGS) IMPLANT
BLADE SURG ROTATE 9660 (MISCELLANEOUS) IMPLANT
BNDG HEMOSTAT MRDH 4X4 STRL (MISCELLANEOUS) ×3
CANISTER SUCTION 2500CC (MISCELLANEOUS) ×3 IMPLANT
CANISTER WOUND CARE 500ML ATS (WOUND CARE) ×3 IMPLANT
CATH MUSHROOM 22FR (CATHETERS) ×3 IMPLANT
CHLORAPREP W/TINT 26ML (MISCELLANEOUS) IMPLANT
COVER SURGICAL LIGHT HANDLE (MISCELLANEOUS) ×3 IMPLANT
DRAPE LAPAROSCOPIC ABDOMINAL (DRAPES) ×3 IMPLANT
DRAPE UTILITY 15X26 W/TAPE STR (DRAPE) ×6 IMPLANT
DRAPE WARM FLUID 44X44 (DRAPE) ×3 IMPLANT
DRSG VAC ATS LRG SENSATRAC (GAUZE/BANDAGES/DRESSINGS) IMPLANT
DRSG VERSA FOAM LRG 10X15 (GAUZE/BANDAGES/DRESSINGS) IMPLANT
ELECT BLADE 6.5 EXT (BLADE) ×3 IMPLANT
ELECT CAUTERY BLADE 6.4 (BLADE) ×3 IMPLANT
ELECT REM PT RETURN 9FT ADLT (ELECTROSURGICAL) ×3
ELECTRODE REM PT RTRN 9FT ADLT (ELECTROSURGICAL) ×1 IMPLANT
GLOVE BIOGEL PI IND STRL 8 (GLOVE) ×1 IMPLANT
GLOVE BIOGEL PI INDICATOR 8 (GLOVE) ×2
GLOVE ECLIPSE 7.5 STRL STRAW (GLOVE) ×3 IMPLANT
GOWN STRL REUS W/ TWL LRG LVL3 (GOWN DISPOSABLE) ×3 IMPLANT
GOWN STRL REUS W/TWL LRG LVL3 (GOWN DISPOSABLE) ×6
KIT BASIN OR (CUSTOM PROCEDURE TRAY) ×3 IMPLANT
KIT ROOM TURNOVER OR (KITS) ×3 IMPLANT
LIGASURE IMPACT 36 18CM CVD LR (INSTRUMENTS) ×3 IMPLANT
NS IRRIG 1000ML POUR BTL (IV SOLUTION) ×6 IMPLANT
PACK GENERAL/GYN (CUSTOM PROCEDURE TRAY) ×3 IMPLANT
PAD ARMBOARD 7.5X6 YLW CONV (MISCELLANEOUS) ×6 IMPLANT
PLUG CATH AND CAP STER (CATHETERS) ×3 IMPLANT
PROBE PENCIL 8 MHZ STRL DISP (MISCELLANEOUS) ×3 IMPLANT
RELOAD PROXIMATE 75MM BLUE (ENDOMECHANICALS) ×6 IMPLANT
SPECIMEN JAR LARGE (MISCELLANEOUS) ×3 IMPLANT
SPONGE ABDOMINAL VAC ABTHERA (MISCELLANEOUS) IMPLANT
SPONGE LAP 18X18 X RAY DECT (DISPOSABLE) ×9 IMPLANT
STAPLER PROXIMATE 75MM BLUE (STAPLE) ×3 IMPLANT
STAPLER VISISTAT 35W (STAPLE) ×3 IMPLANT
SUCTION POOLE TIP (SUCTIONS) ×3 IMPLANT
SUT ETHILON 2 0 FS 18 (SUTURE) ×3 IMPLANT
SUT PDS AB 1 TP1 96 (SUTURE) ×6 IMPLANT
SUT SILK 2 0 SH CR/8 (SUTURE) ×3 IMPLANT
SUT SILK 2 0 TIES 10X30 (SUTURE) ×3 IMPLANT
SUT SILK 3 0 SH CR/8 (SUTURE) ×3 IMPLANT
SUT SILK 3 0 TIES 10X30 (SUTURE) ×3 IMPLANT
TOWEL OR 17X24 6PK STRL BLUE (TOWEL DISPOSABLE) ×3 IMPLANT
TOWEL OR 17X26 10 PK STRL BLUE (TOWEL DISPOSABLE) ×3 IMPLANT
TRAY FOLEY CATH 14FRSI W/METER (CATHETERS) IMPLANT
TUBE CONNECTING 12'X1/4 (SUCTIONS) ×2
TUBE CONNECTING 12X1/4 (SUCTIONS) ×4 IMPLANT
WATER STERILE IRR 1000ML POUR (IV SOLUTION) IMPLANT
YANKAUER SUCT BULB TIP NO VENT (SUCTIONS) IMPLANT

## 2013-12-23 NOTE — Progress Notes (Signed)
Received verbal consent via telephone to transfuse blood products to the patient from his wife, Loney LohShannon Mages. Consent verified by Clare GandyK. Kolangayil James, RN.  Bosie HelperS. Chaitra Mast, RN

## 2013-12-23 NOTE — Progress Notes (Signed)
ETT advanced 3cm from 24 at the lip to 27 at the lip per Dr.Mcquaid after chest xray. BBS equal.

## 2013-12-23 NOTE — OR Nursing (Signed)
Radiology reported via telephone no signs of foreign object on abdominal x ray post operatively

## 2013-12-23 NOTE — Progress Notes (Signed)
Bladder scanned patient. 175 mL of urine detected in bladder. Bosie HelperS. Aashritha Miedema, RN
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
## 2013-12-23 NOTE — Transfer of Care (Signed)
Immediate Anesthesia Transfer of Care Note  Patient: Stephen Stephens  Procedure(s) Performed: Procedure(s): ABDOMINAL VACUUM ASSISTED CLOSURE CHANGE (N/A) SMALL BOWEL RESECTION (N/A)  Patient Location: NICU  Anesthesia Type:General  Level of Consciousness: sedated, unresponsive and Patient remains intubated per anesthesia plan  Airway & Oxygen Therapy: Patient remains intubated per anesthesia plan and Patient placed on Ventilator (see vital sign flow sheet for setting)  Post-op Assessment: Post -op Vital signs reviewed and stable and report to ICU RN  Post vital signs: Reviewed and stable  Complications: No apparent anesthesia complications

## 2013-12-23 NOTE — Progress Notes (Signed)
Trauma on call paged for third time at 2041 for critical Calcium value of 5.8. Bosie HelperS. Breydon Senters, RN
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
## 2013-12-23 NOTE — Progress Notes (Signed)
eLink Physician-Brief Progress Note Patient Name: Stephen PaschalMichael R Stephens DOB: 1981/02/14 MRN: [id number]   Date of Service  12/16/2013  HPI/Events of Note  Back from OR, now acidemic on ABG Massive transfusion yesterday Worsening renal failure Oliguric Hypocalcemic shock  eICU Interventions  Increase RR on vent Replace calcium Repeat ABG/BMET 0100 10/10 Check CK and lactic acid Wean off propofol, increase fentanyl for sedation     Intervention Category Major Interventions: Respiratory failure - evaluation and management;Acid-Base disturbance - evaluation and management Intermediate Interventions: Diagnostic test evaluation Minor Interventions: Electrolytes abnormality - evaluation and management  Stephen Stephens 01/13/2014, 9:17 PM

## 2013-12-23 NOTE — Progress Notes (Signed)
RT notified me of iSTAT values of PH of 7.263 and HCO3 of 18.2 with pO2 of 88%. Trauma on call paged. Bosie HelperS. Nneka Blanda, RN
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
## 2013-12-23 NOTE — Progress Notes (Signed)
Started a unit of PRBC at 1558 and OR was here to pick up the patient at that time. Sent the blood hanging/infusing with the OR staff.   Unit # [id number]  Documentation is incomplete from our end because I was not with the blood after getting the infusion started.   Lillyn Wieczorek GARNER
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
## 2013-12-23 NOTE — Progress Notes (Addendum)
     Subjective  - Intubated.   Objective 89/59 127 98.8 F (37.1 C) (Axillary) 22 98%  Intake/Output Summary (Last 24 hours) at 01/09/2014 0755 Last data filed at 01/09/2014 [id number]  Gross per 24 hour  Intake 8084.9 ml  Output   9011 ml  Net -926.1 ml    Right PT doppler faint, no signals on the left No ischemic changes to bilateral LE Abdominal wound vac in place  Assessment/Planning: POD #1 Procedure: Procedure(s):  EXPLORATORY LAPAROTOMY  Repair superior mesenteric artery with insertion of interposition greater saphenous vein graft from left leg  SMALL BOWEL RESECTION  APPLICATION OF WOUND VAC  MESENTERIC ARTERY BYPASS    Stephen Stephens, Stephen Stephen Stephens 01/06/2014 7:55 AM --  Laboratory Lab Results:  Recent Labs  01/11/2014 0055 01/03/2014 0540  WBC 11.2* 11.9*  HGB 13.2 14.6  HCT 38.1* 40.8  PLT 51* 44*   BMET  Recent Labs  07/18/13 1323 01/08/2014 0540  NA 154* 147  K 3.3* 4.0  CL 108 105  CO2 26 20  GLUCOSE 213* 148*  BUN 20 33*  CREATININE 2.71* 4.79*  CALCIUM 7.1* 6.8*    COAG Lab Results  Component Value Date   INR 2.40* 01/04/2014   INR 1.31 01/07/2014   INR 1.48 12/30/2013   No results found for this basename: PTT   Remaining fairly stable on ventilator Hematocrit 40% with platelet count 44,000 INR 2.40  Will likely need platelet transfusion and additional FFP Possible return to OR tomorrow per trauma service Will continue to follow with you
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
## 2013-12-24 ENCOUNTER — Inpatient Hospital Stay (HOSPITAL_COMMUNITY): Payer: BC Managed Care – PPO

## 2013-12-24 DIAGNOSIS — N179 Acute kidney failure, unspecified: Secondary | ICD-10-CM

## 2013-12-24 LAB — CBC WITH DIFFERENTIAL/PLATELET
BASOS ABS: 0 10*3/uL (ref 0.0–0.1)
Basophils Relative: 0 % (ref 0–1)
EOS ABS: 0 10*3/uL (ref 0.0–0.7)
Eosinophils Relative: 1 % (ref 0–5)
HEMATOCRIT: 26 % — AB (ref 39.0–52.0)
Hemoglobin: 9.1 g/dL — ABNORMAL LOW (ref 13.0–17.0)
LYMPHS ABS: 0.4 10*3/uL — AB (ref 0.7–4.0)
LYMPHS PCT: 20 % (ref 12–46)
MCH: 29.7 pg (ref 26.0–34.0)
MCHC: 35 g/dL (ref 30.0–36.0)
MCV: 85 fL (ref 78.0–100.0)
Monocytes Absolute: 0.1 10*3/uL (ref 0.1–1.0)
Monocytes Relative: 4 % (ref 3–12)
Neutro Abs: 1.7 10*3/uL (ref 1.7–7.7)
Neutrophils Relative %: 75 % (ref 43–77)
Platelets: 78 10*3/uL — ABNORMAL LOW (ref 150–400)
RBC: 3.06 MIL/uL — ABNORMAL LOW (ref 4.22–5.81)
RDW: 15.2 % (ref 11.5–15.5)
WBC Morphology: INCREASED
WBC: 2.2 10*3/uL — ABNORMAL LOW (ref 4.0–10.5)

## 2013-12-24 LAB — PREPARE FRESH FROZEN PLASMA
UNIT DIVISION: 0
UNIT DIVISION: 0
UNIT DIVISION: 0
UNIT DIVISION: 0
UNIT DIVISION: 0
UNIT DIVISION: 0
Unit division: 0
Unit division: 0
Unit division: 0
Unit division: 0
Unit division: 0
Unit division: 0

## 2013-12-24 LAB — PREPARE PLATELET PHERESIS
UNIT DIVISION: 0
UNIT DIVISION: 0
Unit division: 0
Unit division: 0

## 2013-12-24 LAB — COMPREHENSIVE METABOLIC PANEL
ALBUMIN: 2 g/dL — AB (ref 3.5–5.2)
ALT: 3852 U/L — ABNORMAL HIGH (ref 0–53)
ANION GAP: 28 — AB (ref 5–15)
AST: 8492 U/L — ABNORMAL HIGH (ref 0–37)
Alkaline Phosphatase: 59 U/L (ref 39–117)
BUN: 40 mg/dL — AB (ref 6–23)
CO2: 13 mEq/L — ABNORMAL LOW (ref 19–32)
CREATININE: 7.2 mg/dL — AB (ref 0.50–1.35)
Calcium: 5.5 mg/dL — CL (ref 8.4–10.5)
Chloride: 98 mEq/L (ref 96–112)
GFR calc Af Amer: 10 mL/min — ABNORMAL LOW (ref >90)
GFR calc non Af Amer: 9 mL/min — ABNORMAL LOW (ref >90)
Glucose, Bld: 128 mg/dL — ABNORMAL HIGH (ref 70–99)
POTASSIUM: 4.3 meq/L (ref 3.7–5.3)
Sodium: 139 mEq/L (ref 137–147)
TOTAL PROTEIN: 3.4 g/dL — AB (ref 6.0–8.3)
Total Bilirubin: 2.4 mg/dL — ABNORMAL HIGH (ref 0.3–1.2)

## 2013-12-24 LAB — BLOOD GAS, ARTERIAL
ACID-BASE DEFICIT: 9.1 mmol/L — AB (ref 0.0–2.0)
Acid-base deficit: 13.3 mmol/L — ABNORMAL HIGH (ref 0.0–2.0)
BICARBONATE: 16.1 meq/L — AB (ref 20.0–24.0)
Bicarbonate: 13.2 mEq/L — ABNORMAL LOW (ref 20.0–24.0)
Drawn by: 41308
Drawn by: 41308
FIO2: 50 %
FIO2: 70 %
MECHVT: 670 mL
MECHVT: 670 mL
O2 SAT: 99 %
O2 Saturation: 86.3 %
PATIENT TEMPERATURE: 98.6
PATIENT TEMPERATURE: 101
PEEP/CPAP: 10 cmH2O
PEEP: 10 cmH2O
RATE: 26 resp/min
RATE: 26 resp/min
TCO2: 17.1 mmol/L (ref 0–100)
TCO2: 14.2 mmol/L (ref 0–100)
pCO2 arterial: 33.6 mmHg — ABNORMAL LOW (ref 35.0–45.0)
pCO2 arterial: 37.3 mmHg (ref 35.0–45.0)
pH, Arterial: 7.301 — ABNORMAL LOW (ref 7.350–7.450)
pH, Arterial: 7.184 — CL (ref 7.350–7.450)
pO2, Arterial: 54.5 mmHg — ABNORMAL LOW (ref 80.0–100.0)
pO2, Arterial: 247 mmHg — ABNORMAL HIGH (ref 80.0–100.0)

## 2013-12-24 LAB — GLUCOSE, CAPILLARY
GLUCOSE-CAPILLARY: 84 mg/dL (ref 70–99)
Glucose-Capillary: 68 mg/dL — ABNORMAL LOW (ref 70–99)
Glucose-Capillary: 128 mg/dL — ABNORMAL HIGH (ref 70–99)
Glucose-Capillary: 23 mg/dL — CL (ref 70–99)
Glucose-Capillary: 52 mg/dL — ABNORMAL LOW (ref 70–99)

## 2013-12-24 LAB — BASIC METABOLIC PANEL
Anion gap: 28 — ABNORMAL HIGH (ref 5–15)
BUN: 40 mg/dL — ABNORMAL HIGH (ref 6–23)
CALCIUM: 5.7 mg/dL — AB (ref 8.4–10.5)
CO2: 14 mEq/L — ABNORMAL LOW (ref 19–32)
Chloride: 99 mEq/L (ref 96–112)
Creatinine, Ser: 6.96 mg/dL — ABNORMAL HIGH (ref 0.50–1.35)
GFR calc Af Amer: 11 mL/min — ABNORMAL LOW (ref >90)
GFR calc non Af Amer: 9 mL/min — ABNORMAL LOW (ref >90)
GLUCOSE: 72 mg/dL (ref 70–99)
POTASSIUM: 4.2 meq/L (ref 3.7–5.3)
SODIUM: 141 meq/L (ref 137–147)

## 2013-12-24 LAB — CK
CK TOTAL: 8356 U/L — AB (ref 7–232)
Total CK: 7309 U/L — ABNORMAL HIGH (ref 7–232)

## 2013-12-24 LAB — PREPARE RBC (CROSSMATCH)

## 2013-12-24 LAB — LACTIC ACID, PLASMA: Lactic Acid, Venous: 12.7 mmol/L — ABNORMAL HIGH (ref 0.5–2.2)

## 2013-12-24 MED ORDER — VANCOMYCIN HCL 10 G IV SOLR
2000.0000 mg | Freq: Once | INTRAVENOUS | Status: AC
  Administered 2013-12-24: 2000 mg via INTRAVENOUS
  Filled 2013-12-24: qty 2000

## 2013-12-24 MED ORDER — SODIUM CHLORIDE 0.9 % IV BOLUS (SEPSIS)
1000.0000 mL | Freq: Once | INTRAVENOUS | Status: AC
  Administered 2013-12-24: 1000 mL via INTRAVENOUS

## 2013-12-24 MED ORDER — EPINEPHRINE HCL 0.1 MG/ML IJ SOSY
PREFILLED_SYRINGE | INTRAMUSCULAR | Status: AC
  Filled 2013-12-24: qty 10

## 2013-12-24 MED ORDER — HEPARIN SODIUM (PORCINE) 5000 UNIT/ML IJ SOLN
5000.0000 [IU] | Freq: Three times a day (TID) | INTRAMUSCULAR | Status: AC
  Administered 2013-12-24: 5000 [IU] via SUBCUTANEOUS
  Filled 2013-12-24: qty 1

## 2013-12-24 MED ORDER — HEPARIN SODIUM (PORCINE) 5000 UNIT/ML IJ SOLN
5000.0000 [IU] | Freq: Once | INTRAMUSCULAR | Status: AC
  Administered 2013-12-24: 5000 [IU] via SUBCUTANEOUS
  Filled 2013-12-24: qty 1

## 2013-12-24 MED ORDER — ALBUMIN HUMAN 5 % IV SOLN
INTRAVENOUS | Status: AC
  Filled 2013-12-24: qty 250

## 2013-12-24 MED ORDER — COAGULATION FACTOR VIIA RECOMB 1 MG IV SOLR
90.0000 ug/kg | Freq: Once | INTRAVENOUS | Status: AC
  Administered 2013-12-24: 11000 ug via INTRAVENOUS
  Filled 2013-12-24: qty 11

## 2013-12-24 MED ORDER — ACETAMINOPHEN 650 MG RE SUPP
650.0000 mg | Freq: Four times a day (QID) | RECTAL | Status: DC | PRN
  Administered 2013-12-24: 650 mg via RECTAL
  Filled 2013-12-24 (×2): qty 1

## 2013-12-24 MED ORDER — SODIUM CHLORIDE 0.9 % IV SOLN
INTRAVENOUS | Status: DC
  Administered 2013-12-24: 01:00:00 via INTRAVENOUS

## 2013-12-24 MED ORDER — DEXTROSE 50 % IV SOLN
25.0000 mL | Freq: Once | INTRAVENOUS | Status: AC
  Administered 2013-12-24: 25 mL via INTRAVENOUS

## 2013-12-24 MED ORDER — SODIUM CHLORIDE 0.9 % IV SOLN
1.0000 g | Freq: Once | INTRAVENOUS | Status: AC
  Administered 2013-12-24: 1 g via INTRAVENOUS
  Filled 2013-12-24: qty 10

## 2013-12-24 MED ORDER — DEXTROSE 10 % IV SOLN
INTRAVENOUS | Status: DC
  Administered 2013-12-24: 50 mL/h via INTRAVENOUS

## 2013-12-24 MED ORDER — SODIUM CHLORIDE 0.9 % IV SOLN
1.0000 mg/h | INTRAVENOUS | Status: DC
  Administered 2013-12-24: 2 mg/h via INTRAVENOUS
  Filled 2013-12-24 (×2): qty 10

## 2013-12-24 MED ORDER — HEPARIN SODIUM (PORCINE) 5000 UNIT/ML IJ SOLN
5000.0000 [IU] | Freq: Three times a day (TID) | INTRAMUSCULAR | Status: DC
  Filled 2013-12-24 (×4): qty 1

## 2013-12-24 MED ORDER — SODIUM CHLORIDE 0.9 % IV SOLN: Freq: Once | INTRAVENOUS | Status: DC

## 2013-12-24 MED ORDER — SODIUM BICARBONATE 8.4 % IV SOLN
100.0000 meq | Freq: Once | INTRAVENOUS | Status: DC
  Filled 2013-12-24: qty 100

## 2013-12-24 MED ORDER — DEXTROSE 50 % IV SOLN
INTRAVENOUS | Status: AC
  Filled 2013-12-24: qty 50

## 2013-12-24 MED ORDER — HEPARIN SODIUM (PORCINE) 5000 UNIT/ML IJ SOLN: 5000.0000 [IU] | Freq: Three times a day (TID) | INTRAMUSCULAR | Status: DC

## 2013-12-24 MED ORDER — VITAMIN K1 10 MG/ML IJ SOLN
10.0000 mg | Freq: Once | INTRAMUSCULAR | Status: DC
  Filled 2013-12-24: qty 1

## 2013-12-24 MED ORDER — EPINEPHRINE HCL 1 MG/ML IJ SOLN
0.5000 ug/min | INTRAVENOUS | Status: DC
  Filled 2013-12-24: qty 1

## 2013-12-24 MED ORDER — FLUCONAZOLE IN SODIUM CHLORIDE 200-0.9 MG/100ML-% IV SOLN
200.0000 mg | Freq: Once | INTRAVENOUS | Status: AC
  Administered 2013-12-24: 200 mg via INTRAVENOUS
  Filled 2013-12-24: qty 100

## 2013-12-24 NOTE — Progress Notes (Signed)
eLink Physician-Brief Progress Note Patient Name: Stephen PaschalMichael R XXXLovett DOB: 1980-07-10 MRN: [id number]   Date of Service  12/16/2013  HPI/Events of Note  Propofol was contributing to shock, now more tachycardic/agitated  eICU Interventions  Low dose versed gtt for sedation     Intervention Category Intermediate Interventions: Hypotension - evaluation and management Minor Interventions: Agitation / anxiety - evaluation and management  Cadel Stairs 12/29/2013, 12:37 AM
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
## 2013-12-24 NOTE — Progress Notes (Signed)
CRITICAL VALUE ALERT  Critical value received:  5.8  Date of notification:  12/30/2013  Time of notification:  2004  Critical value read back:Yes.    Nurse who received alert:  Enid SkeensSara Timoteo Carreiro, RN  MD notified (1st page):  Donell BeersByerly  Time of first page:  2010  MD notified (2nd page): Donell BeersByerly  Time of second page: 2029  Responding MD:  Kendrick FriesMcQuaid  Time MD responded:  2050

## 2013-12-24 NOTE — Progress Notes (Signed)
ABG results given to Dr. Eliberto IvoryAustin, FIO2 increased to 70%. No new orders at this time.

## 2013-12-24 NOTE — Progress Notes (Signed)
Blood glucose checked with CBG monitor, value of 68. Hypoglycemic protocol initiated and 25mL of D50 given via IV push. Fifteen minutes later blood glucose was checked with CBG monitor and a value of 52 was given. Another 25mL of D50 was given via IV push, and Dr. Kendrick FriesMcQuaid was notified via [facility] following protocol. At 0504 blood glucose was checked again via CBG monitor and a value of 23 was received. At that time I felt that the values may be inaccurate and pulled arterial blood from the arterial line to test. A value of 128 was received from the arterial blood. Dr. Kendrick FriesMcQuaid was notified of this discrepancy via Elink. Orders were given to begin a D10 drip. Will continue to monitor. Enid SkeensSara Sakeenah Valcarcel, RN

## 2013-12-24 NOTE — Progress Notes (Signed)
eLink Physician-Brief Progress Note Patient Name: Stephen PaschalMichael R XXXLovett DOB: April 12, 1980 MRN: [id number]   Date of Service  01/13/2014  HPI/Events of Note  Sudden onset of abdominal swelling, bright red blood from wound vac followed by cardiac arrest  eICU Interventions  Massive transfusion protocol ordered Epi gtt Bicarb pushes Calcium ACLS protocol followed 25 minutes during which time 3 U PRBC given through rapid transfuser> PEA arrest, bradycardia Trauma to bedside We jointly decided that we could not proceed further given coagulopathy as he would not survive surgery Time of death 0710     Intervention Category Major Interventions: Code management / supervision  MCQUAID, DOUGLAS 12/25/2013, 7:13 AM

## 2013-12-24 NOTE — Progress Notes (Signed)
Hypoglycemic Event  CBG: 67   Treatment: D50 IV 25 mL  Symptoms: None  Follow-up CBG: Time:0430 CBG Result:52  Possible Reasons for Event: Unknown and Other: hemodynamically unstable and critical patient. NPO  Comments/MD notified:McQuiad    Stephen Stephens, Stephen Stephens  Remember to initiate Hypoglycemia Order Set & complete

## 2013-12-24 NOTE — Progress Notes (Signed)
CRITICAL VALUE ALERT  Critical value received:  Calcium 5.7   Date of notification:  12/15/2013  Time of notification:  0450  Critical value read back:Yes.    Nurse who received alert:  Enid SkeensSara Kaylanni Ezelle, RN  MD notified (1st page):  McQuaid  Time of first page:  0450  MD notified (2nd page):  Time of second page:  Responding MD:  Kendrick FriesMcQuaid   Time MD responded:  [phone]
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
## 2013-12-24 NOTE — Progress Notes (Signed)
eLink Physician-Brief Progress Note Patient Name: Stephen PaschalMichael R Stephens DOB: 1980-11-02 MRN: [id number]   Date of Service  12/21/2013  HPI/Events of Note  Acidosis> worsening hypoglycemia  eICU Interventions  Bicarb now Start dextrose HD cath in, will contact renal for HD first thing this morning     Intervention Category Major Interventions: Acid-Base disturbance - evaluation and management Intermediate Interventions: Electrolyte abnormality - evaluation and management  Queenie Aufiero 01/06/2014, 4:46 AM
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
## 2013-12-24 NOTE — Procedures (Signed)
Central Venous Catheter Insertion Procedure Note Eda PaschalMichael R XXXLovett [id number] 1980/07/14  Procedure: Insertion of Central Venous Catheter Indications: Hemodialysis  Procedure Details  Time Out: Verified patient identification, verified procedure, site/side was marked, verified correct patient position, special equipment/implants available, medications/allergies/relevent history reviewed, required imaging and test results available.  Performed  Maximum sterile technique was used including antiseptics, cap, gloves, gown, hand hygiene, mask and sheet. Skin prep: Chlorhexidine; local anesthetic administered The R femoral introducer sheath was accessed with a wire.  Then, the introducer sheath was removed over the wire while leaving the wire in place in the vessel.  Then, a antimicrobial bonded/coated triple lumen HD catheter was exchanged over a wire using the Seldinger technique.  Evaluation Blood flow:  Arterial port has excellent flow and return.  Venous has excellent flow, sluggish return.  Central port has excellent flow and return Complications: No apparent complications Patient did tolerate procedure well. Line ready to use.  Otis DialsUSTIN, Shekela Goodridge 12/28/2013, 2:30 AM

## 2013-12-24 NOTE — Progress Notes (Signed)
ABG values of pH 7.18, pO2 247, CO2 37.3, HCO3 13.2, deficit of -13. Dr. Kendrick FriesMcQuaid notified through [facility]. Enid SkeensSara Lanyia Jewel, RN
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
## 2013-12-24 NOTE — Progress Notes (Signed)
eLink Physician-Brief Progress Note Patient Name: Eda PaschalMichael R XXXLovett DOB: [birth year] MRN: [id number]   Date of Service  12/18/2013  HPI/Events of Note  Labs reviewed > CK 7K Lactic acid 10.5, not surprising with shock, ischemic bowel seen in OR  eICU Interventions  Change fluids to NS, increase rate to 150cc/hr Repeat lactic acid/CK Will likely need renal consult in AM     Intervention Category Intermediate Interventions: Diagnostic test evaluation  MCQUAID, DOUGLAS 01/04/2014, 1:04 AM
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
## 2013-12-25 LAB — TYPE AND SCREEN
ABO/RH(D): O POS
ANTIBODY SCREEN: NEGATIVE
UNIT DIVISION: 0
UNIT DIVISION: 0
UNIT DIVISION: 0
UNIT DIVISION: 0
UNIT DIVISION: 0
UNIT DIVISION: 0
UNIT DIVISION: 0
UNIT DIVISION: 0
UNIT DIVISION: 0
UNIT DIVISION: 0
UNIT DIVISION: 0
UNIT DIVISION: 0
UNIT DIVISION: 0
UNIT DIVISION: 0
UNIT DIVISION: 0
UNIT DIVISION: 0
UNIT DIVISION: 0
UNIT DIVISION: 0
UNIT DIVISION: 0
UNIT DIVISION: 0
UNIT DIVISION: 0
Unit division: 0
Unit division: 0
Unit division: 0
Unit division: 0
Unit division: 0
Unit division: 0
Unit division: 0
Unit division: 0
Unit division: 0
Unit division: 0
Unit division: 0
Unit division: 0
Unit division: 0
Unit division: 0
Unit division: 0
Unit division: 0
Unit division: 0
Unit division: 0
Unit division: 0
Unit division: 0
Unit division: 0
Unit division: 0
Unit division: 0
Unit division: 0
Unit division: 0
Unit division: 0
Unit division: 0
Unit division: 0
Unit division: 0
Unit division: 0
Unit division: 0
Unit division: 0
Unit division: 0
Unit division: 0

## 2013-12-25 LAB — PREPARE CRYOPRECIPITATE: UNIT DIVISION: 0

## 2013-12-26 ENCOUNTER — Encounter (HOSPITAL_COMMUNITY): Payer: Self-pay | Admitting: General Surgery
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
## 2014-01-07 MED FILL — Medication: Qty: 1 | Status: AC

## 2014-01-09 NOTE — Anesthesia Postprocedure Evaluation (Signed)
  Anesthesia Post-op Note  Patient: Stephen Stephens  Procedure(s) Performed: Procedure(s): ABDOMINAL VACUUM ASSISTED CLOSURE CHANGE (N/A) SMALL BOWEL RESECTION (N/A)  Patient Location: ICU  Anesthesia Type:General  Level of Consciousness: obtunded, comatose and Patient remains intubated per anesthesia plan  Airway and Oxygen Therapy: Patient remains intubated per anesthesia plan and Patient placed on Ventilator (see vital sign flow sheet for setting)  Post-op Pain: none  Post-op Assessment: Post-op Vital signs reviewed, PATIENT'S CARDIOVASCULAR STATUS UNSTABLE and Respiratory Function Stable  Post-op Vital Signs: unstable  Last Vitals:  Filed Vitals:   01/14/2014 0715  BP:   Pulse: 132  Temp:   Resp: 0    Complications: No apparent anesthesia complications

## 2014-01-15 DEATH — deceased
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
## 2015-06-18 IMAGING — CR DG ABD PORTABLE 1V
2 series · 2 of 2 positions shown · non-contrast
Comparison: 12/22/2013

CLINICAL DATA: Incorrect sponge count during surgery.

EXAM:
PORTABLE ABDOMEN - 1 VIEW

[AP (1 of 2)]
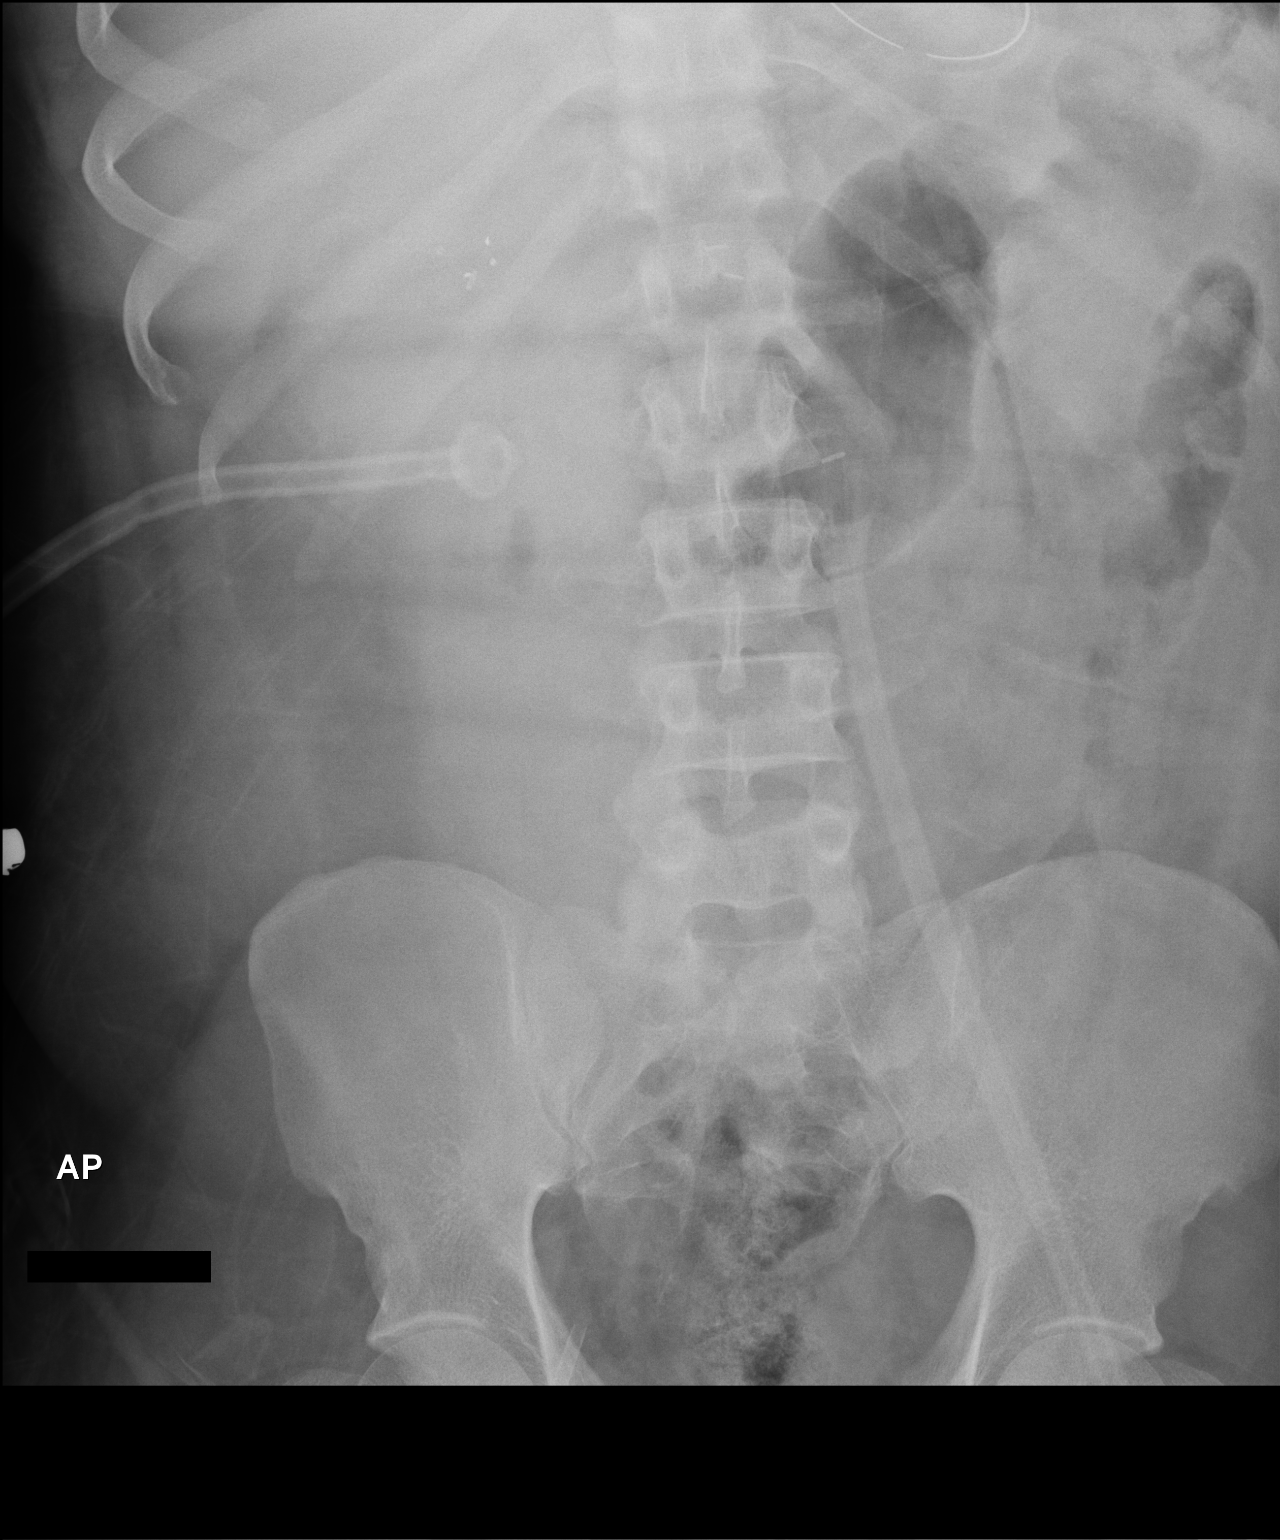

[AP (2 of 2)]
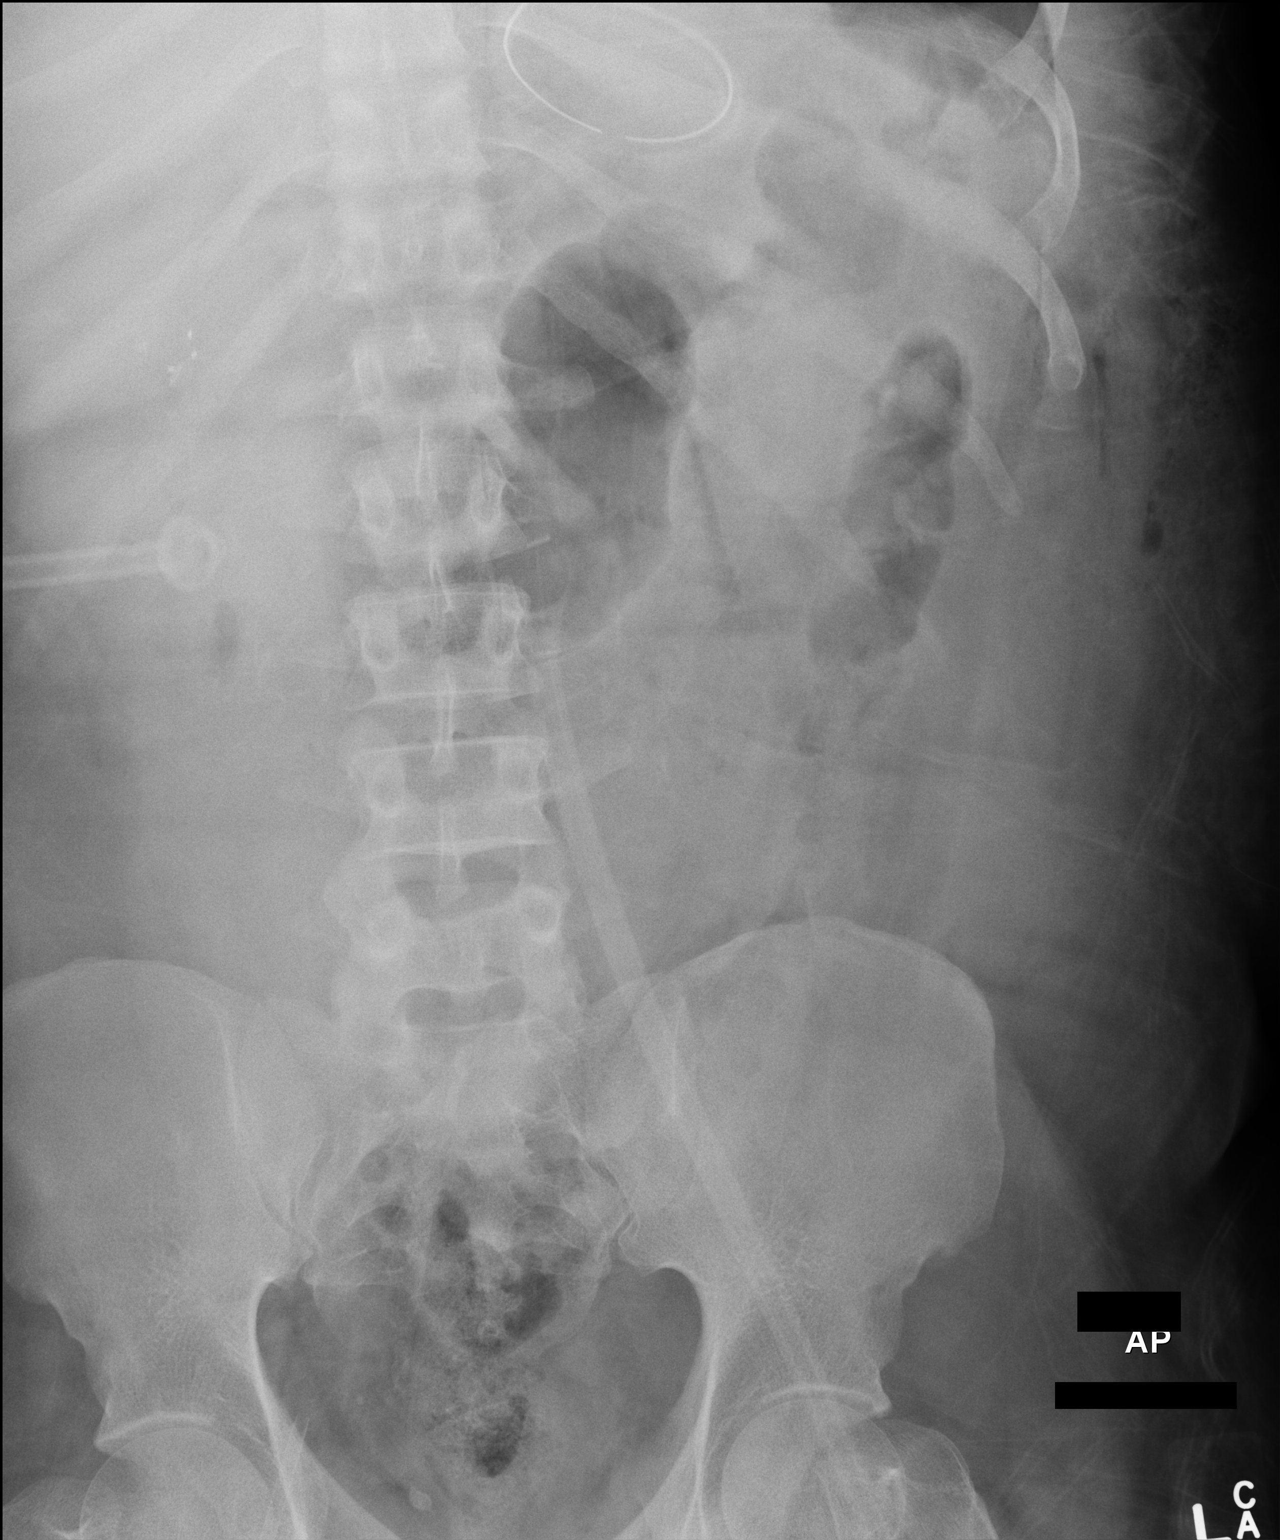

[2 of 2 positions shown; findings below may reference images not displayed]

FINDINGS: No unexpected radiodensities are identified in the visualized
portion of the abdomen or pelvis to suggest a retained operative
sponge.
IMPRESSION: No evidence of retained operative sponge.

## 2015-10-08 ENCOUNTER — Encounter (HOSPITAL_COMMUNITY): Payer: Self-pay | Admitting: *Deleted
